# Patient Record
Sex: Female | Born: 1960 | Race: Black or African American | Hispanic: No | Smoking: Never smoker
Health system: Southern US, Community
[De-identification: ages and names within clinical notes are randomized; demographics above are authoritative.]

## PROBLEM LIST (undated history)

## (undated) DIAGNOSIS — F329 Major depressive disorder, single episode, unspecified: Secondary | ICD-10-CM

## (undated) DIAGNOSIS — G4730 Sleep apnea, unspecified: Secondary | ICD-10-CM

## (undated) DIAGNOSIS — I10 Essential (primary) hypertension: Secondary | ICD-10-CM

## (undated) DIAGNOSIS — F419 Anxiety disorder, unspecified: Secondary | ICD-10-CM

## (undated) DIAGNOSIS — F32A Depression, unspecified: Secondary | ICD-10-CM

## (undated) HISTORY — PX: BREAST BIOPSY: SHX20

## (undated) HISTORY — DX: Anxiety disorder, unspecified: F41.9

## (undated) HISTORY — DX: Depression, unspecified: F32.A

## (undated) HISTORY — DX: Essential (primary) hypertension: I10

## (undated) HISTORY — DX: Major depressive disorder, single episode, unspecified: F32.9

---

## 2001-05-10 ENCOUNTER — Ambulatory Visit (HOSPITAL_COMMUNITY): Admission: RE | Admit: 2001-05-10 | Discharge: 2001-05-11 | Payer: Self-pay | Admitting: Neurosurgery

## 2001-05-10 ENCOUNTER — Encounter: Payer: Self-pay | Admitting: Neurosurgery

## 2002-03-06 ENCOUNTER — Other Ambulatory Visit: Admission: RE | Admit: 2002-03-06 | Discharge: 2002-03-06 | Payer: Self-pay | Admitting: Family Medicine

## 2006-07-18 ENCOUNTER — Emergency Department: Payer: Self-pay | Admitting: Emergency Medicine

## 2012-10-17 ENCOUNTER — Ambulatory Visit: Payer: Self-pay | Admitting: Family Medicine

## 2012-12-17 ENCOUNTER — Ambulatory Visit: Payer: Self-pay | Admitting: Family Medicine

## 2014-02-24 ENCOUNTER — Ambulatory Visit: Payer: Self-pay | Admitting: Family Medicine

## 2015-04-16 LAB — COMPREHENSIVE METABOLIC PANEL
Albumin: 3.9 g/dL
Alkaline Phosphatase: 104 U/L
Anion Gap: 8 (ref 7–16)
BUN: 21 mg/dL — ABNORMAL HIGH
Bilirubin,Total: 0.6 mg/dL
Calcium, Total: 9.1 mg/dL
Chloride: 101 mmol/L
Co2: 30 mmol/L
Creatinine: 0.65 mg/dL
EGFR (African American): >60
EGFR (Non-African Amer.): >60
Glucose: 125 mg/dL — ABNORMAL HIGH
Potassium: 2.8 mmol/L — ABNORMAL LOW
SGOT(AST): 27 U/L
SGPT (ALT): 25 U/L
Sodium: 139 mmol/L
Total Protein: 7.2 g/dL

## 2015-04-16 LAB — CBC
HCT: 38.9 % (ref 35.0–47.0)
HGB: 12.7 g/dL (ref 12.0–16.0)
MCH: 27.2 pg (ref 26.0–34.0)
MCHC: 32.6 g/dL (ref 32.0–36.0)
MCV: 84 fL (ref 80–100)
Platelet: 287 10*3/uL (ref 150–440)
RBC: 4.65 10*6/uL (ref 3.80–5.20)
RDW: 14.3 % (ref 11.5–14.5)
WBC: 7.8 10*3/uL (ref 3.6–11.0)

## 2015-04-16 LAB — TROPONIN I: Troponin-I: 0.03 ng/mL

## 2015-04-17 ENCOUNTER — Other Ambulatory Visit: Payer: Self-pay | Admitting: Physician Assistant

## 2015-04-17 ENCOUNTER — Inpatient Hospital Stay
Admit: 2015-04-17 | Disposition: A | Discharge status: Home / Self Care | Payer: Self-pay | Attending: Internal Medicine | Admitting: Internal Medicine

## 2015-04-17 DIAGNOSIS — R079 Chest pain, unspecified: Secondary | ICD-10-CM

## 2015-04-17 DIAGNOSIS — I213 ST elevation (STEMI) myocardial infarction of unspecified site: Secondary | ICD-10-CM

## 2015-04-17 DIAGNOSIS — E876 Hypokalemia: Secondary | ICD-10-CM

## 2015-04-17 DIAGNOSIS — R7989 Other specified abnormal findings of blood chemistry: Secondary | ICD-10-CM

## 2015-04-17 DIAGNOSIS — I10 Essential (primary) hypertension: Secondary | ICD-10-CM

## 2015-04-17 LAB — BASIC METABOLIC PANEL
ANION GAP: 7 (ref 7–16)
BUN: 20 mg/dL
Calcium, Total: 9 mg/dL
Chloride: 102 mmol/L
Co2: 31 mmol/L
Creatinine: 0.65 mg/dL
EGFR (African American): >60
EGFR (Non-African Amer.): >60
GLUCOSE: 119 mg/dL — AB
Potassium: 3.4 mmol/L — ABNORMAL LOW
Sodium: 140 mmol/L

## 2015-04-17 LAB — TROPONIN I
TROPONIN-I: 0.06 ng/mL — AB
Troponin-I: 0.04 ng/mL — ABNORMAL HIGH

## 2015-04-17 LAB — MAGNESIUM: Magnesium: 2 mg/dL

## 2015-04-17 LAB — CK-MB
CK-MB: 2 ng/mL
CK-MB: 2.3 ng/mL
CK-MB: 2.3 ng/mL

## 2015-04-20 ENCOUNTER — Encounter: Payer: Self-pay | Admitting: Cardiovascular Disease

## 2015-04-26 NOTE — H&P (Signed)
PATIENT NAME:  Rachel Short, Rachel Short MR#:  [id number] DATE OF BIRTH:  [birth year]  DATE OF ADMISSION:  04/17/2015  REFERRING PHYSICIAN:  Gretchen Short. Beather Arbour, MD  PRIMARY CARE PHYSICIAN:  [facility]  CHIEF COMPLAINT:  Palpitations.   HISTORY OF PRESENT ILLNESS:  This is a 54 year old African-American female with a history of hypertension, essential, as well as anxiety, not otherwise specified, presenting with palpitations. She describes acute onset of symptoms around 5:00 p.m. on 04/16/2015, as palpitations and weakness. These symptoms lasted until 9:00 p.m. Given the duration, this prompted her visit to the hospital for further workup and evaluation. She actually denies any frank chest pain, shortness of breath, or other symptomatology.   Initial ER course including laboratory data and EKG:  Findings were essentially unremarkable; however, they decided to trend her cardiac enzymes and noted an upward trend. Cardiac enzyme is currently 0.06, upward from 0.03. She was subsequently given Lovenox, and the Emergency Department asked for hospital admission. The patient currently denies any further symptoms.   REVIEW OF SYSTEMS: CONSTITUTIONAL:  Denies fevers, chills, fatigue, or weakness.  EYES:  Denies blurred vision, double vision, or eye pain.  EARS, NOSE, AND THROAT:  Denies tinnitus, ear pain, or hearing loss. RESPIRATORY:  Denies cough or shortness of breath.  CARDIOVASCULAR:  Denies chest pain or palpitations. Denies any edema.  GASTROINTESTINAL:  Denies nausea, vomiting, diarrhea, or abdominal pain.  GENITOURINARY:  Denies dysuria or hematuria.  ENDOCRINE:  Denies nocturia or thyroid problems.  HEMATOLOGIC AND LYMPHATIC:  Denies easy bruising or bleeding. SKIN:  Denies rash or lesion. MUSCULOSKELETAL:  Denies pain in the neck, back, shoulders, knees, or hips or arthritic symptoms.  NEUROLOGIC:  Denies paralysis or paresthesias.  PSYCHIATRIC:  Positive for anxiety as described above. Denies  any depressive symptoms.   Otherwise, full review of systems performed by me is negative.   PAST MEDICAL HISTORY:  Essential hypertension; anxiety, not otherwise specified.   SOCIAL HISTORY:  Positive for secondhand smoke exposure. Occasional alcohol use. Denies any drug use.   FAMILY HISTORY:  Denies any known cardiovascular or pulmonary disorders.   ALLERGIES:  No known drug allergies.   HOME MEDICATIONS:  Include ibuprofen 200 mg 4 tablets every 6 hours as needed for pain, Tylenol 500 mg 2 tablets q. 6 hours as needed for pain, clonazepam 0.5 mg p.o. daily as needed for anxiety, gabapentin 300 mg p.o. at bedtime, fluoxetine 20 mg p.o. daily, hydrochlorothiazide 25 mg p.o. daily.   PHYSICAL EXAMINATION: VITAL SIGNS:  Temperature 98.2, heart rate 78, respirations 17, blood pressure 135/90, saturation 96% on room air. Weight 78 kg, BMI 30.2.  GENERAL:  Obese African-American female currently in no acute distress.  HEAD:  Normocephalic, atraumatic.  EYES:  Pupils are equal, round, and reactive to light. Extraocular muscles are intact. No scleral icterus. Moist mucous membranes. Dentition is intact. No abscess is noted.  EARS, NOSE, AND THROAT:  Clear without exudates. No external lesions.  NECK:  Supple. No thyromegaly. No nodules. No JVD.  PULMONARY:  Clear to auscultation bilaterally without wheezes, rales, or rhonchi. No use of accessory muscles. Good respiratory effort.  CHEST:  Nontender to palpation.  CARDIOVASCULAR:  S1 and S2, regular rate and rhythm. No murmurs, rubs, or gallops. No edema. Pedal pulses are 2+ bilaterally.  GASTROINTESTINAL:  Soft, nontender, nondistended. No masses. Positive bowel sounds. No hepatosplenomegaly. MUSCULOSKELETAL:  No swelling, clubbing, or edema. Range of motion is full in all extremities.  NEUROLOGIC:  Cranial nerves II through  XII are intact. No gross neurological deficit. Sensation is intact. Reflexes are intact.  SKIN:  No ulceration, lesion,  rash, or cyanosis. Skin is warm and dry. Turgor is intact.  PSYCHIATRIC:  Mood and affect are within normal limits. The patient is awake, alert, and oriented x 3. Insight and judgment are intact.   LABORATORY DATA:  EKG performed:  Normal sinus rhythm. No ST-T wave abnormalities. Remainder of laboratory data:  Sodium 139, potassium 2.9, chloride 101, bicarbonate 30, BUN 21, creatinine 0.65, glucose 125. LFTs are within normal limits. Troponin is 0.03, trending upwards to 0.06. WBC is 7.8, hemoglobin 12.7, and platelets are 287,000.   ASSESSMENT AND PLAN:  A 54 year old African-American female with a history of essential hypertension, presenting with palpitations and noted upward cardiac enzyme trend in the Emergency Department and received Lovenox via ER staff.    1.  Non-ST segment elevation myocardial infarction indicated by elevated troponins. Initiate aspirin and statin therapy. We will place on telemetry and trend cardiac enzymes x 3 in total. Consult cardiology. She has already received therapeutic dosing of Lovenox with continued upward trend of enzymes. We will continue therapeutic dosing.  2.  Hypokalemia. Replace potassium, goal 4 to 5.  3.  Hypertension, essential. We will hold hydrochlorothiazide for now and can restart the next day.  4.  Venous thromboembolism prophylaxis with therapeutic Lovenox.   CODE STATUS:  The patient is a full code.  TIME SPENT:  35 minutes.    ____________________________ Aaron Mose. Naitik Hermann, MD dkh:nb D: 04/17/2015 03:42:05 ET T: 04/17/2015 04:19:09 ET JOB#: [id number]  cc: Aaron Mose. Crissa Sowder, MD, <Dictator> Hadassa Cermak Woodfin Ganja MD ELECTRONICALLY SIGNED 04/19/2015 2:47

## 2015-04-26 NOTE — Consult Note (Signed)
General Aspect Primary Cardiologist: New to [facility] ___________________  54 year old female with history of HTN, anxiety, obesity, DDD, and fibromyalgia who presented to [facility] on 4/21 with 4 hour history of palpitations.  ___________________  PMH: 1. HTN 2. Anxiety 3. Obesity 4. DDD 5. Fibromyalgia ____________________   Present Illness 54 year old female with the above problem list who presented to [facility] on 4/21 with the above CC. No prior known cardiac history. She has never seen a cardiologist.   She has a long history of anxiety and palpitations and she usually just lets self resolve. However, on 4/21 she had an episode of on anxiety and palpitations that last longer than usual for her prompting her to come in. She reports this episode lasting approximately 4 hours until EMS arrived, then they self resolved. Upon interviewing her she initially stated she did have chest pain lasting 4 hours, she later denied this statement. There was no associated SOB, diaphoresis, presyncope, or syncope. There was some nausea without emesis. She reports having had some nausea the prior day as well, reports eating some bad food. She has been symptom free since her arrival. She denies any tobacco use (lives with a smoker), rare ETOH, no illicits.   Upon her arrival to [facility] she was found to have a troponin of 0.03-->0.06-->0.04. Echo showed EF 55-60%, mild LVH, mildly dilated LA. EKG showed sinus tachycardia, 102 bpm, no significant st/t changes. K+ 2.8-->3.4. She was given Lovenox 80 mg x 1. She is currently asymptomatic.   Physical Exam:  GEN no acute distress, obese   HEENT PERRL, hearing intact to voice, moist oral mucosa   NECK supple  trachea midline  no JVD   RESP normal resp effort  clear BS   CARD Regular rate and rhythm  No murmur   ABD denies tenderness  soft   EXTR negative edema   SKIN normal to palpation   NEURO cranial nerves intact   PSYCH alert, A+O to time, place, person    Review of Systems:  General: No Complaints   Skin: No Complaints   ENT: No Complaints   Eyes: No Complaints   Neck: No Complaints   Respiratory: No Complaints   Cardiovascular: Palpitations   Gastrointestinal: Nausea   Genitourinary: No Complaints   Vascular: No Complaints   Musculoskeletal: No Complaints   Neurologic: No Complaints   Hematologic: No Complaints   Endocrine: No Complaints   Psychiatric: Anxiety   Review of Systems: All other systems were reviewed and found to be negative   Medications/Allergies Reviewed Medications/Allergies reviewed   Family & Social History:  Family and Social History:  Family History father: killed in late 102s; mother: [place]; aunt: CAD   Social History negative tobacco, negative ETOH, positive ETOH, negative Illicit drugs   Place of Living Home     Anxiety:    Osteoarthritis:    Hypertension:    PTSD:    Degenerative Disc Disease:    Borderline Diabetic:    sleep apnea:    Hysterectomy:   Home Medications: Medication Instructions Status  gabapentin 300 mg oral capsule 1-2 cap(s) orally once a day (at bedtime) Active  hydrochlorothiazide 25 mg oral tablet 1 tab(s) orally once a day Active  clonazePAM 0.5 mg oral tablet 1 tab(s) orally once a day, As Needed - for Anxiety, Nervousness Active  FLUoxetine 20 mg oral capsule 1 cap(s) orally once a day Active  Tylenol 500 mg oral tablet 2 tab(s) orally every 6 hours,  As Needed - for Headache, for Pain  Active  ibuprofen 200 mg oral tablet 4 tab(s) orally every 6 hours, As Needed - for Headache, for Pain  Active   Lab Results:  Routine Chem:  21-Apr-16 22:26   Potassium, Serum  2.8 (3.5-5.1 NOTE: New Reference Range  03/03/15)  22-Apr-16 05:39   Magnesium, Serum 2.0 (1.7-2.4 THERAPEUTIC RANGE: 4-7 mg/dL TOXIC: > 10 mg/dL  ----------------------- NOTE: New Reference Range  03/03/15)  Glucose, Serum  119 (65-99 NOTE: New Reference Range  03/03/15)  BUN  20 (6-20 NOTE: New Reference Range  03/03/15)  Creatinine (comp) 0.65 (0.44-1.00 NOTE: New Reference Range  03/03/15)  Sodium, Serum 140 (135-145 NOTE: New Reference Range  03/03/15)  Potassium, Serum  3.4 (3.5-5.1 NOTE: New Reference Range  03/03/15)  Chloride, Serum 102 (101-111 NOTE: New Reference Range  03/03/15)  CO2, Serum 31 (22-32 NOTE: New Reference Range  03/03/15)  Calcium (Total), Serum 9.0 (8.9-10.3 NOTE: New Reference Range  03/03/15)  Anion Gap 7  eGFR (African American) >60  eGFR (Non-African American) >60 (eGFR values <70m/min/1.73 m2 may be an indication of chronic kidney disease (CKD). Calculated eGFR is useful in patients with stable renal function. The eGFR calculation will not be reliable in acutely ill patients when serum creatinine is changing rapidly. It is not useful in patients on dialysis. The eGFR calculation may not be applicable to patients at the low and high extremes of body sizes, pregnant women, and vegetarians.)  Result Comment - TROPONIN  - PREVIOUSLY CALLED  - 04-17-15 '@[id number]'  BY AJO  - AJO  Result(s) reported on 17 Apr 2015 at 06:49AM.  Cardiac:  21-Apr-16 22:26   Troponin I 0.03 (0.00-0.03 0.03 ng/mL or less: NEGATIVE  Repeat testing in 3-6 hrs  if clinically indicated. >0.05 ng/mL: POTENTIAL  MYOCARDIAL INJURY. Repeat  testing in 3-6 hrs if  clinically indicated. NOTE: An increase or decrease  of 30% or more on serial  testing suggests a  clinically important change NOTE: New Reference Range  03/03/15)  CPK-MB, Serum 2.0 (0.5-5.0 NOTE: New Reference Range  03/03/15)  22-Apr-16 01:22   Troponin I  0.06 (0.00-0.03 0.03 ng/mL or less: NEGATIVE  Repeat testing in 3-6 hrs  if clinically indicated. >0.05 ng/mL: POTENTIAL  MYOCARDIAL INJURY. Repeat  testing in 3-6 hrs if  clinically indicated. NOTE: An increase or decrease  of 30% or more on serial  testing suggests a  clinically important change NOTE: New Reference  Range  03/03/15)  CPK-MB, Serum 2.3 (0.5-5.0 NOTE: New Reference Range  03/03/15)    05:39   Troponin I  0.04 (0.00-0.03 0.03 ng/mL or less: NEGATIVE  Repeat testing in 3-6 hrs  if clinically indicated. >0.05 ng/mL: POTENTIAL  MYOCARDIAL INJURY. Repeat  testing in 3-6 hrs if  clinically indicated. NOTE: An increase or decrease  of 30% or more on serial  testing suggests a  clinically important change NOTE: New Reference Range  03/03/15)  CPK-MB, Serum 2.3 (0.5-5.0 NOTE: New Reference Range  03/03/15)  Routine Hem:  21-Apr-16 22:26   WBC (CBC) 7.8  RBC (CBC) 4.65  Hemoglobin (CBC) 12.7  Hematocrit (CBC) 38.9  Platelet Count (CBC) 287 (Result(s) reported on 16 Apr 2015 at 10:42PM.)   EKG:  EKG Interp. by me   Interpretation sinus tachycardia, 102 bpm, no significant st/t changes   Radiology Results: Cardiology:    22-Apr-16 07:19, Echo Doppler  Echo Doppler   REASON FOR EXAM:      COMMENTS:  PROCEDURE: [facility] - ECHO DOPPLER COMPLETE(TRANSTHOR)  - Apr 17 2015  7:19AM     RESULT: Echocardiogram Report    Patient Name:   Rachel Short Date of Exam: 04/17/2015  Medical Rec #:  [id number]           Custom1:  Date of Birth:  January 02, 1961        Height:       64.0 in  Patient Age:    89 years         Weight:       177.0 lb  Patient Gender: F                BSA:          1.86 m??    Indications: MI  Sonographer:    Sherrie Sport RDCS  Referring Phys: Valentino Nose, K    Summary:   1. Left ventricular ejection fraction, by visual estimation, is 55 to   60%.   2. Normal global left ventricular systolic function.   3. Mild concentric left ventricular hypertrophy.   4. Mildly dilated left atrium.  2D AND M-MODE MEASUREMENTS (normal ranges within parentheses):  Left Ventricle:          Normal  IVSd (2D):      1.50 cm (0.7-1.1)  LVPWd (2D):     1.25 cm (0.7-1.1) Aorta/LA:                  Normal  LVIDd (2D):     3.45 cm (3.4-5.7) Aortic Root (2D): 3.00 cm  (2.4-3.7)  LVIDs (2D):     2.36 cm           Left Atrium (2D): 2.60 cm (1.9-4.0)  LV FS (2D):     31.6 %   (>25%)  LV EF (2D):     60.7 %   (>50%)                                    Right Ventricle:                                    RVd (2D):        0.45 cm  LV DIASTOLIC FUNCTION:  MV Peak E: 0.83 m/s E/e' Ratio: 10.80  MV Peak A: 0.73 m/s Decel Time: 269 msec  E/A Ratio: 1.13  SPECTRAL DOPPLER ANALYSIS (where applicable):  Mitral Valve:  MV P1/2 Time: 78.01 msec  MV Area, PHT: 2.82 cm??  Aortic Valve: AoVMax Vel: 1.30 m/s AoV Peak PG: 6.8 mmHg AoV Mean PG:  LVOT Vmax: 1.08 m/s LVOT VTI:  LVOT Diameter: 2.00 cm  AoV Area, Vmax: 2.61 cm?? AoV Area, VTI:  AoV Area, Vmn:  Tricuspid Valve and PA/RV Systolic Pressure: TR Max Velocity: 2.17 m/s RA     Pressure: 5 mmHg RVSP/PASP: 23.8 mmHg  Pulmonic Valve:  PV Max Velocity: 0.87 m/s PV Max PG: 3.0 mmHg PV Mean PG:    PHYSICIAN INTERPRETATION:  Left Ventricle: The left ventricular internal cavity size was normal.   Mild concentric left ventricular hypertrophy. Global LV systolic function   was normal. Left ventricular ejection fraction, by visual estimation, is   55 to 60%. Spectral Doppler shows normal pattern of LV diastolic filling.  Right Ventricle: Normal right ventricular size, wall thickness, and   systolic function.  Left Atrium: The left atrium is mildly dilated.  Right Atrium: The right atrium is normal in size and structure.  Pericardium: There is no evidence of pericardial effusion.  Mitral Valve: The mitral valve is normal in structure. No evidence of     mitral valve stenosis. Trace mitral valve regurgitation is seen.  Tricuspid Valve: The tricuspid valve is normal. Trivial tricuspid   regurgitation is visualized. The tricuspid regurgitant velocity is 2.17   m/s, and with an assumed right atrial pressure of 5 mmHg, the estimated   right ventricular systolic pressure is normal at 23.8 mmHg.  Aortic Valve: The aortic  valve is trileaflet and structurally normal,   with normal leaflet excursion; without any evidence of aortic stenosis or   insufficiency.  Pulmonic Valve: The pulmonic valve is not well seen. No indication of   pulmonic valve regurgitation.  Aorta: The aortic root is normal in size and structure.  Venous: The inferior vena cava was normal. The inferior vena cavawas   normal sized with respiratory size variation greater than 50%.    [id number] Kathlyn Sacramento MD  Electronically signed by [id number] Kathlyn Sacramento MD  Signature Date/Time: 04/17/2015/7:59:40 AM    *** Final ***    IMPRESSION: .        Verified By: Mertie Clause. ARIDA, M.D., MD    No Known Allergies:   Vital Signs/Nurse's Notes: **Vital Signs.:   22-Apr-16 08:07  Vital Signs Type Routine  Temperature Temperature (F) 98.1  Celsius 36.7  Temperature Source oral  Pulse Pulse 98  Respirations Respirations 18  Systolic BP Systolic BP 683  Diastolic BP (mmHg) Diastolic BP (mmHg) 78  Mean BP 91  Pulse Ox % Pulse Ox % 98  Pulse Ox Activity Level  At rest  Oxygen Delivery Room Air/ 21 %    Impression 54 year old female with history of HTN, anxiety, obesity, DDD, and fibromyalgia who presented to [facility] on 4/21 with 4 hour history of palpitations.  1. Elevated troponin: -Mildly elevated and down trending -Likely supply demand ischemia in the setting of tachy-palpitations -Plan for treadmill Myoview today to evaluate for high risk ischemia -Echo showed normal LV function without wall motion abnormalities  -Continue aspirin 81 mg  2. Hypokalemia: -Admission K+ 2.8-->3.4 -Continue repletion to goal 4.0  3. Anxiety: -Continue home medications -Follow up with PCP   Electronic Signatures for Addendum Section:  Kathlyn Sacramento (MD) (Signed Addendum 22-Apr-16 13:18)  The patient was seen and examined. Agree with the above. She reports intermittent palpitations and had a prolonged episode before presentation. She denied any chest  discomfort to me. No exertional symptoms. TnI was mildly elevated. No murmurs by exam. ECG with no ischemic changes. She was hypokalemia on presentation. She takes HCTZ for hypertension.  suspect some form of tachyarrhythmia. Elevated tnI is likely due to supply demand ischemia.  Recommend: treadmil myoview today.  Switch HCTZ to Metoprolol 25 mg bid.   Electronic Signatures: Kathlyn Sacramento (MD)  (Signed 22-Apr-16 13:18)  Co-Signer: General Aspect/Present Illness, History and Physical Exam, Review of System, Family & Social History, Past Medical History, Home Medications, Labs, EKG , Radiology, Allergies, Vital Signs/Nurse's Notes, Impression/Plan Ayiana Winslett M (PA-C)  (Signed 22-Apr-16 09:57)  Authored: General Aspect/Present Illness, History and Physical Exam, Review of System, Family & Social History, Past Medical History, Home Medications, Labs, EKG , Radiology, Allergies, Vital Signs/Nurse's Notes, Impression/Plan   Last Updated: 22-Apr-16 13:18 by Kathlyn Sacramento (MD)

## 2015-04-26 NOTE — Discharge Summary (Signed)
PATIENT NAME:  Rachel Short, LOGUE MR#:  [id number] DATE OF BIRTH:  Sep 10, 1961  DATE OF ADMISSION:  04/17/2015 DATE OF DISCHARGE:  04/17/2015  PRIMARY CARE PHYSICIAN:  Nonlocal.   CONSULTATIONS:  Kathlyn Sacramento, MD, cardiologist.   DISCHARGE DIAGNOSES:  1. Elevated troponin due to demand ischemia in the setting of tachycardia and palpitations.  2. Hypokalemia.  3. Anxiety.  4. Obesity.   CONDITION: Stable.   CODE STATUS:  FULL CODE.   HOME MEDICATIONS: Please refer to the medication reconciliation list.  Hydrochlorothiazide  was discontinued due to hypokalemia.  We will start Lopressor 25 mg 0.5 tablets b.i.d., ibuprofen is discontinued.   DIET: Low sodium, low fat, low cholesterol diet.   ACTIVITY: As tolerated.   FOLLOWUP CARE: With PCP and Dr. Fletcher Anon within 1-2 weeks.   REASON FOR ADMISSION: Palpitations.   HOSPITAL COURSE: The patient is a 54 year old African American female with a history of hypertension and anxiety, comes to ED due to palpitation. For detailed history and physical examination, please refer to the admission note dictated by Dr. Lavetta Nielsen. On admission date, the patient's troponin was 0.04 but is up to 0.06. The patient has been treated with aspirin and statin.   Dr. Fletcher Anon suggests a stress test and echocardiograph.  Echocardiograph showed normal ejection fraction at 55-60%. The patient got stress test.  which is normal. The patient has no chest pain. Her vital signs are stable. She is clinically stable; will be discharged to home today. I discussed the patient's discharge plan with the patient, nurse, and case manager.   TIME SPENT: About 37 minutes.     ____________________________ Demetrios Loll, MD qc:tr D: 04/17/2015 16:19:23 ET T: 04/17/2015 17:46:02 ET JOB#: [id number]  cc: Demetrios Loll, MD, <Dictator> Demetrios Loll MD ELECTRONICALLY SIGNED 04/18/2015 16:04

## 2015-05-14 ENCOUNTER — Encounter: Payer: Self-pay | Admitting: Cardiovascular Disease

## 2015-06-03 ENCOUNTER — Encounter: Payer: Self-pay | Admitting: *Deleted

## 2015-06-04 ENCOUNTER — Encounter: Payer: Self-pay | Admitting: *Deleted

## 2015-06-04 ENCOUNTER — Encounter: Payer: Self-pay | Admitting: Cardiovascular Disease

## 2016-09-09 ENCOUNTER — Other Ambulatory Visit: Payer: Self-pay | Admitting: Internal Medicine

## 2016-09-09 DIAGNOSIS — M546 Pain in thoracic spine: Secondary | ICD-10-CM

## 2017-08-21 ENCOUNTER — Emergency Department
Admission: EM | Admit: 2017-08-21 | Discharge: 2017-08-21 | Disposition: A | Discharge status: Home / Self Care | Payer: Medicare Other | Attending: Emergency Medicine | Admitting: Emergency Medicine

## 2017-08-21 ENCOUNTER — Emergency Department: Payer: Medicare Other

## 2017-08-21 ENCOUNTER — Encounter: Payer: Self-pay | Admitting: *Deleted

## 2017-08-21 DIAGNOSIS — R000 Tachycardia, unspecified: Secondary | ICD-10-CM | POA: Diagnosis present

## 2017-08-21 DIAGNOSIS — E876 Hypokalemia: Secondary | ICD-10-CM | POA: Insufficient documentation

## 2017-08-21 DIAGNOSIS — I471 Supraventricular tachycardia: Secondary | ICD-10-CM | POA: Insufficient documentation

## 2017-08-21 DIAGNOSIS — Z7982 Long term (current) use of aspirin: Secondary | ICD-10-CM | POA: Insufficient documentation

## 2017-08-21 DIAGNOSIS — I10 Essential (primary) hypertension: Secondary | ICD-10-CM | POA: Insufficient documentation

## 2017-08-21 LAB — CBC WITH DIFFERENTIAL/PLATELET
BASOS ABS: 0.1 10*3/uL (ref 0–0.1)
Basophils Relative: 1 %
Eosinophils Absolute: 0 10*3/uL (ref 0–0.7)
Eosinophils Relative: 1 %
HEMATOCRIT: 36.2 % (ref 35.0–47.0)
Hemoglobin: 12.2 g/dL (ref 12.0–16.0)
LYMPHS PCT: 24 %
Lymphs Abs: 1.4 10*3/uL (ref 1.0–3.6)
MCH: 28.9 pg (ref 26.0–34.0)
MCHC: 33.7 g/dL (ref 32.0–36.0)
MCV: 85.9 fL (ref 80.0–100.0)
Monocytes Absolute: 0.3 10*3/uL (ref 0.2–0.9)
Monocytes Relative: 5 %
NEUTROS ABS: 4.1 10*3/uL (ref 1.4–6.5)
Neutrophils Relative %: 69 %
Platelets: 263 10*3/uL (ref 150–440)
RBC: 4.21 MIL/uL (ref 3.80–5.20)
RDW: 14.3 % (ref 11.5–14.5)
WBC: 5.9 10*3/uL (ref 3.6–11.0)

## 2017-08-21 LAB — BASIC METABOLIC PANEL
Anion gap: 9 (ref 5–15)
BUN: 22 mg/dL — ABNORMAL HIGH (ref 6–20)
CO2: 27 mmol/L (ref 22–32)
Calcium: 9.3 mg/dL (ref 8.9–10.3)
Chloride: 106 mmol/L (ref 101–111)
Creatinine, Ser: 0.53 mg/dL (ref 0.44–1.00)
GFR calc Af Amer: >60 mL/min (ref >60)
GLUCOSE: 120 mg/dL — AB (ref 65–99)
POTASSIUM: 3.3 mmol/L — AB (ref 3.5–5.1)
Sodium: 142 mmol/L (ref 135–145)

## 2017-08-21 LAB — URINALYSIS, COMPLETE (UACMP) WITH MICROSCOPIC
BILIRUBIN URINE: NEGATIVE
Bacteria, UA: NONE SEEN
Glucose, UA: NEGATIVE mg/dL
HGB URINE DIPSTICK: NEGATIVE
Ketones, ur: 5 mg/dL — AB
LEUKOCYTES UA: NEGATIVE
NITRITE: NEGATIVE
PROTEIN: 30 mg/dL — AB
RBC / HPF: NONE SEEN RBC/hpf (ref 0–5)
Specific Gravity, Urine: 1.017 (ref 1.005–1.030)
pH: 6 (ref 5.0–8.0)

## 2017-08-21 LAB — TROPONIN I: Troponin I: <0.03 ng/mL (ref <0.03)

## 2017-08-21 MED ORDER — POTASSIUM CHLORIDE CRYS ER 20 MEQ PO TBCR
40.0000 meq | EXTENDED_RELEASE_TABLET | Freq: Once | ORAL | Status: AC
  Administered 2017-08-21: 40 meq via ORAL
  Filled 2017-08-21: qty 2

## 2017-08-21 MED ORDER — POTASSIUM CHLORIDE ER 10 MEQ PO TBCR: 10.0000 meq | EXTENDED_RELEASE_TABLET | Freq: Every day | ORAL | 0 refills | Status: DC

## 2017-08-21 MED ORDER — SODIUM CHLORIDE 0.9 % IV BOLUS (SEPSIS)
500.0000 mL | Freq: Once | INTRAVENOUS | Status: AC
  Administered 2017-08-21: 500 mL via INTRAVENOUS

## 2017-08-21 NOTE — ED Triage Notes (Signed)
Pt brought in via ems from home with rapid heart rte.   Ems gave 6mg  of adenisone.   Sinus tach on monitor. No chest pain.  Pt alert.  Iv in place.  md with pt.

## 2017-08-21 NOTE — ED Provider Notes (Signed)
[facility] Emergency Department Provider Note ____________________________________________   First MD Initiated Contact with Patient 08/21/17 1612     (approximate)  I have reviewed the triage vital signs and the nursing notes.   HISTORY  Chief Complaint Tachycardia    HPI Rachel Short is a 56 y.o. female with a history of anxiety and hypertension who presents with palpitations acute onset approximately 3 hours ago, associated with feeling lightheaded and wobbly in her legs, and intermittent over the last few hours. Patient thought she was was having an anxiety attack and took her anxiety medication, but it didn't help. She denies any previous history of palpitations as intense as she felt today. Patient denies chest pain, shortness of breath, or vomiting. She does report some nausea. Denies any recent illness, fever or chills, or weakness.    Past Medical History:  Diagnosis Date  . Anxiety and depression   . Hypertension     There are no active problems to display for this patient.   No past surgical history on file.  Prior to Admission medications   Medication Sig Start Date End Date Taking? Authorizing Provider  aspirin 81 MG chewable tablet Chew by mouth daily.   Yes [provider]  atorvastatin (LIPITOR) 20 MG tablet Take 20 mg by mouth daily.   Yes [provider]  FLUoxetine (PROZAC) 40 MG capsule Take 40 mg by mouth daily.   Yes [provider]  metoprolol tartrate (LOPRESSOR) 25 MG tablet Take 12.5 mg by mouth 2 (two) times daily.   Yes [provider]  cyclobenzaprine (FLEXERIL) 10 MG tablet Take 1 tablet by mouth daily as needed. 08/05/17   [provider]  gabapentin (NEURONTIN) 300 MG capsule Take 300 mg by mouth 2 (two) times daily as needed.    [provider]  potassium chloride (K-DUR) 10 MEQ tablet Take 1 tablet (10 mEq total) by mouth daily. 08/22/17 09/05/17  Arta Silence, MD    Allergies Patient has no known allergies.  No family history on file.  Social History Social History  Substance Use Topics  . Smoking status: Never Smoker  . Smokeless tobacco: Never Used  . Alcohol use No    Review of Systems  Constitutional: No fever/chills Eyes: No visual changes. ENT: Positive for throat discomfort. Cardiovascular: Denies chest pain. Positive for palpitations. Respiratory: Denies shortness of breath. Gastrointestinal: No nausea, no vomiting.  No diarrhea.  Genitourinary: Negative for dysuria.  Musculoskeletal: Negative for back pain. Skin: Negative for rash. Neurological: Negative for headaches, focal weakness or numbness. Positive for lightheadedness.   ____________________________________________   PHYSICAL EXAM:  VITAL SIGNS: ED Triage Vitals  Enc Vitals Group     BP 08/21/17 1617 99/82     Pulse Rate 08/21/17 1617 (!) 115     Resp 08/21/17 1617 20     Temp 08/21/17 1617 98.6 F (37 C)     Temp Source 08/21/17 1617 Oral     SpO2 08/21/17 1617 99 %     Weight 08/21/17 1619 176 lb (79.8 kg)     Height 08/21/17 1619 5\' 5"  (1.651 m)     Head Circumference --      Peak Flow --      Pain Score --      Pain Loc --      Pain Edu? --      Excl. in [place]? --     Constitutional: Alert and oriented. Well appearing and in no  acute distress. Eyes: Conjunctivae are normal.  Head: Atraumatic. Nose: No congestion/rhinnorhea. Mouth/Throat: Slightly dry mucous membranes.  Neck: Normal range of motion.  Cardiovascular: Tachycardic, regular rhythm. Grossly normal heart sounds.  Good peripheral circulation. Respiratory: Normal respiratory effort.  No retractions. Lungs CTAB. Gastrointestinal: Soft and nontender. No distention.  Genitourinary: No CVA tenderness. Musculoskeletal: No lower extremity edema.  Extremities warm and well perfused.  Neurologic:  Normal speech and language. No gross focal neurologic deficits are appreciated.    Skin:  Skin is warm and dry. No rash noted. Psychiatric: Mood and affect are normal. Speech and behavior are normal.  ____________________________________________   LABS (all labs ordered are listed, but only abnormal results are displayed)  Labs Reviewed  BASIC METABOLIC PANEL - Abnormal; Notable for the following:       Result Value   Potassium 3.3 (*)    Glucose, Bld 120 (*)    BUN 22 (*)    All other components within normal limits  URINALYSIS, COMPLETE (UACMP) WITH MICROSCOPIC - Abnormal; Notable for the following:    Color, Urine YELLOW (*)    APPearance CLOUDY (*)    Ketones, ur 5 (*)    Protein, ur 30 (*)    Squamous Epithelial / LPF 6-30 (*)    All other components within normal limits  CBC WITH DIFFERENTIAL/PLATELET  TROPONIN I   ____________________________________________  EKG  ED ECG REPORT I, Arta Silence, the attending physician, personally viewed and interpreted this ECG.  Date: 08/21/2017 EKG Time: 1616 Rate: 111 Rhythm: sinus tachycardia QRS Axis: normal Intervals: normal ST/T Wave abnormalities: normal Narrative Interpretation: sinus tachycardia, no other acute findings  ____________________________________________  RADIOLOGY    ____________________________________________   PROCEDURES  Procedure(s) performed: No    Critical Care performed: No ____________________________________________   INITIAL IMPRESSION / ASSESSMENT AND PLAN / ED COURSE  Pertinent labs & imaging results that were available during my care of the patient were reviewed by me and considered in my medical decision making (see chart for details).  56 year old female presents with palpitations of acute onset associated with lightheadedness and some nausea. Somewhat waxing and waning in intensity and improved currently. Per EMS patient was found to be in SVT which resolved with 6 mg of adenosine. Currently in the ED patient has sinus tachycardia to the 110s,  with otherwise normal VS, she is relatively well-appearing, and exam is otherwise unremarkable. Overall suspect most likely SVT although there is no EMS rhythm strip available to prove this. Unclear why patient is currently slightly tachycardic but suspect there may be underlying baseline mild dehydration or other cause for sinus tachycardia. Do not suspect the patient was in afib given acute resolution of the arrhythmia. Low suspicion for cardiac ischemia.  Given no chest pain, shortness of breath, hypoxia,unilateral leg swelling, or any risk factors for PE or DVT, there is no clinical evidence to suspect PE currently.  Plan: Cardiac monitor, basic labs, troponin 1, chest x-ray and reassess.    ----------------------------------------- 7:53 PM on 08/21/2017 -----------------------------------------  Patient's repeat heart rate is now in the 80s and she is in sinus rhythm on the monitor. Workup is unremarkable except for mild hypokalemia - will d/c with course of outpatient PO potassium.  She has had no recurrence of the palpitations in the emergency department.  I instructed patient to follow up with her primary care doctor and make an appointment with a cardiologist. She was instructed to return to the ER immediately for new or recurrent palpitations, chest tightness  or any other symptoms that concern her.  ____________________________________________   FINAL CLINICAL IMPRESSION(S) / ED DIAGNOSES  Final diagnoses:  SVT (supraventricular tachycardia) (HCC)  Hypokalemia      NEW MEDICATIONS STARTED DURING THIS VISIT:  New Prescriptions   POTASSIUM CHLORIDE (K-DUR) 10 MEQ TABLET    Take 1 tablet (10 mEq total) by mouth daily.     Note:  This document was prepared using Dragon voice recognition software and may include unintentional dictation errors.    Arta Silence, MD 08/21/17 2044

## 2017-08-21 NOTE — ED Notes (Signed)
nsr on monitor.  Pt alert.  Iv dc'ed.  D/c inst to pt and family.

## 2017-08-21 NOTE — ED Notes (Signed)
esignature signed by pt.  D/c inst to pt.

## 2017-08-21 NOTE — ED Notes (Signed)
Pt pulled iv out accidentally.    Iv restarted.  Pt up to bathroom with assistance

## 2017-08-21 NOTE — Discharge Instructions (Signed)
Follow-up with your primary care provider within the next week. You should also make an appointment to follow-up with a cardiologist.  Return to the ER for any new, worsening, or recurrent symptoms that concern you.

## 2017-08-21 NOTE — ED Notes (Signed)
Pt alert.  Pt had episode of svt at home  Sinus tach on monitor now.  Iv in place.  No n/v/d.  Pt alert.  Speech clear.  Pt denies chest pain or sob.

## 2017-10-18 ENCOUNTER — Other Ambulatory Visit: Payer: Self-pay | Admitting: Primary Care

## 2017-10-18 DIAGNOSIS — Z1231 Encounter for screening mammogram for malignant neoplasm of breast: Secondary | ICD-10-CM

## 2017-11-03 ENCOUNTER — Ambulatory Visit
Admission: RE | Admit: 2017-11-03 | Discharge: 2017-11-03 | Disposition: A | Discharge status: Home / Self Care | Payer: Medicare Other | Source: Ambulatory Visit | Attending: Primary Care | Admitting: Primary Care

## 2017-11-03 DIAGNOSIS — Z1231 Encounter for screening mammogram for malignant neoplasm of breast: Secondary | ICD-10-CM | POA: Insufficient documentation

## 2018-01-15 ENCOUNTER — Telehealth: Payer: Self-pay

## 2018-01-15 NOTE — Telephone Encounter (Signed)
Gastroenterology Pre-Procedure Review  Request Date:   Requesting Physician: Dr.    PATIENT REVIEW QUESTIONS: The patient responded to the following health history questions as indicated:    1. Are you having any GI issues? No  2. Do you have a personal history of Polyps? No  3. Do you have a family history of Colon Cancer or Polyps? Yes, Mother Colon mass/CA 4. Diabetes Mellitus? No  5. Joint replacements in the past 12 months? No  6. Major health problems in the past 3 months? No  7. Any artificial heart valves, MVP, or defibrillator? No     MEDICATIONS & ALLERGIES:    Patient reports the following regarding taking any anticoagulation/antiplatelet therapy:   Plavix, Coumadin, Eliquis, Xarelto, Lovenox, Pradaxa, Brilinta, or Effient? No  Aspirin? Yes, 81 mg  Patient confirms/reports the following medications:  Current Outpatient Medications  Medication Sig Dispense Refill  . aspirin 81 MG chewable tablet Chew by mouth daily.    Marland Kitchen atorvastatin (LIPITOR) 20 MG tablet Take 20 mg by mouth daily.    Marland Kitchen FLUoxetine (PROZAC) 40 MG capsule Take 40 mg by mouth as needed.     . metoprolol tartrate (LOPRESSOR) 25 MG tablet Take 12.5 mg by mouth 2 (two) times daily.     No current facility-administered medications for this visit.     Patient confirms/reports the following allergies:  No Known Allergies  No orders of the defined types were placed in this encounter.   AUTHORIZATION INFORMATION Primary Insurance: 1D#: Group #:  Secondary Insurance: 1D#: Group #:  SCHEDULE INFORMATION: Date: 02/14/18 Time: Location: [place]

## 2018-01-16 ENCOUNTER — Other Ambulatory Visit: Payer: Self-pay

## 2018-01-16 DIAGNOSIS — Z1211 Encounter for screening for malignant neoplasm of colon: Secondary | ICD-10-CM

## 2018-02-09 ENCOUNTER — Ambulatory Visit: Payer: Self-pay | Admitting: Podiatry

## 2018-02-14 ENCOUNTER — Ambulatory Visit: Payer: Medicare Other | Admitting: Anesthesiology

## 2018-02-14 ENCOUNTER — Encounter
Admission: RE | Disposition: A | Discharge status: Home / Self Care | Payer: Self-pay | Source: Ambulatory Visit | Attending: Gastroenterology

## 2018-02-14 ENCOUNTER — Ambulatory Visit
Admission: RE | Admit: 2018-02-14 | Discharge: 2018-02-14 | Disposition: A | Discharge status: Home / Self Care | Payer: Medicare Other | Source: Ambulatory Visit | Attending: Gastroenterology | Admitting: Gastroenterology

## 2018-02-14 ENCOUNTER — Encounter: Payer: Self-pay | Admitting: Student

## 2018-02-14 DIAGNOSIS — I10 Essential (primary) hypertension: Secondary | ICD-10-CM | POA: Insufficient documentation

## 2018-02-14 DIAGNOSIS — Z79899 Other long term (current) drug therapy: Secondary | ICD-10-CM | POA: Insufficient documentation

## 2018-02-14 DIAGNOSIS — G4730 Sleep apnea, unspecified: Secondary | ICD-10-CM | POA: Insufficient documentation

## 2018-02-14 DIAGNOSIS — Z1211 Encounter for screening for malignant neoplasm of colon: Secondary | ICD-10-CM | POA: Insufficient documentation

## 2018-02-14 DIAGNOSIS — F419 Anxiety disorder, unspecified: Secondary | ICD-10-CM | POA: Diagnosis not present

## 2018-02-14 DIAGNOSIS — D122 Benign neoplasm of ascending colon: Secondary | ICD-10-CM | POA: Diagnosis not present

## 2018-02-14 DIAGNOSIS — K640 First degree hemorrhoids: Secondary | ICD-10-CM | POA: Diagnosis not present

## 2018-02-14 DIAGNOSIS — F329 Major depressive disorder, single episode, unspecified: Secondary | ICD-10-CM | POA: Insufficient documentation

## 2018-02-14 DIAGNOSIS — Z7982 Long term (current) use of aspirin: Secondary | ICD-10-CM | POA: Diagnosis not present

## 2018-02-14 HISTORY — PX: COLONOSCOPY WITH PROPOFOL: SHX5780

## 2018-02-14 HISTORY — DX: Sleep apnea, unspecified: G47.30

## 2018-02-14 SURGERY — COLONOSCOPY WITH PROPOFOL
Anesthesia: General

## 2018-02-14 MED ORDER — PROPOFOL 10 MG/ML IV BOLUS
INTRAVENOUS | Status: DC | PRN
  Administered 2018-02-14: 90 mg via INTRAVENOUS

## 2018-02-14 MED ORDER — LIDOCAINE HCL (PF) 2 % IJ SOLN
INTRAMUSCULAR | Status: AC
  Filled 2018-02-14: qty 10

## 2018-02-14 MED ORDER — PROPOFOL 500 MG/50ML IV EMUL
INTRAVENOUS | Status: DC | PRN
  Administered 2018-02-14: 140 ug/kg/min via INTRAVENOUS

## 2018-02-14 MED ORDER — SODIUM CHLORIDE 0.9 % IV SOLN
INTRAVENOUS | Status: DC
  Administered 2018-02-14: 13:00:00 via INTRAVENOUS

## 2018-02-14 MED ORDER — PROPOFOL 500 MG/50ML IV EMUL
INTRAVENOUS | Status: AC
  Filled 2018-02-14: qty 50

## 2018-02-14 MED ORDER — LIDOCAINE HCL (CARDIAC) 20 MG/ML IV SOLN
INTRAVENOUS | Status: DC | PRN
  Administered 2018-02-14: 50 mg via INTRAVENOUS

## 2018-02-14 NOTE — H&P (Signed)
     Jonathon Bellows, MD [address] [place], [place], [place], [address] [address], [place], [place], [place], [address] Phone: [phone]  Fax: [phone]  Primary Care Physician:  Freddy Finner, NP   Pre-Procedure History & Physical: HPI:  Rachel Short is a 57 y.o. female is here for an colonoscopy.   Past Medical History:  Diagnosis Date  . Anxiety and depression   . Hypertension   . Sleep apnea     Past Surgical History:  Procedure Laterality Date  . BREAST BIOPSY Right    neg    Prior to Admission medications   Medication Sig Start Date End Date Taking? Authorizing Provider  aspirin 81 MG chewable tablet Chew by mouth daily.   Yes [provider]  metoprolol tartrate (LOPRESSOR) 25 MG tablet Take 12.5 mg by mouth 2 (two) times daily.   Yes [provider]  atorvastatin (LIPITOR) 20 MG tablet Take 20 mg by mouth daily.    [provider]  FLUoxetine (PROZAC) 40 MG capsule Take 40 mg by mouth as needed.     [provider]    Allergies as of 01/16/2018  . (No Known Allergies)    Family History  Problem Relation Age of Onset  . Breast cancer Neg Hx     Social History   Socioeconomic History  . Marital status: Married    Spouse name: Not on file  . Number of children: Not on file  . Years of education: Not on file  . Highest education level: Not on file  Social Needs  . Financial resource strain: Not on file  . Food insecurity - worry: Not on file  . Food insecurity - inability: Not on file  . Transportation needs - medical: Not on file  . Transportation needs - non-medical: Not on file  Occupational History  . Not on file  Tobacco Use  . Smoking status: Never Smoker  . Smokeless tobacco: Never Used  Substance and Sexual Activity  . Alcohol use: Yes    Comment: occasional every 3-4 months  . Drug use: No  . Sexual activity: Not on file  Other Topics Concern  . Not on file  Social History Narrative   . Not on file    Review of Systems: See HPI, otherwise negative ROS  Physical Exam: BP (!) 143/83   Pulse 80   Temp (!) 97.2 F (36.2 C) (Tympanic)   Resp 20   Ht 5\' 4"  (1.626 m)   Wt 185 lb (83.9 kg)   SpO2 96%   BMI 31.76 kg/m  General:   Alert,  pleasant and cooperative in NAD Head:  Normocephalic and atraumatic. Neck:  Supple; no masses or thyromegaly. Lungs:  Clear throughout to auscultation, normal respiratory effort.    Heart:  +S1, +S2, Regular rate and rhythm, No edema. Abdomen:  Soft, nontender and nondistended. Normal bowel sounds, without guarding, and without rebound.   Neurologic:  Alert and  oriented x4;  grossly normal neurologically.  Impression/Plan: Rachel Short is here for an colonoscopy to be performed for Screening colonoscopy average risk   Risks, benefits, limitations, and alternatives regarding  colonoscopy have been reviewed with the patient.  Questions have been answered.  All parties agreeable.   Jonathon Bellows, MD  02/14/2018, 12:27 PM

## 2018-02-14 NOTE — Anesthesia Preprocedure Evaluation (Signed)
Anesthesia Evaluation  Patient identified by MRN, date of birth, ID band Patient awake    Reviewed: Allergy & Precautions, H&P , NPO status , reviewed documented beta blocker date and time   Airway Mallampati: III  TM Distance: >3 FB     Dental  (+) Partial Upper   Pulmonary sleep apnea ,    Pulmonary exam normal        Cardiovascular hypertension, Normal cardiovascular exam     Neuro/Psych PSYCHIATRIC DISORDERS Anxiety Depression    GI/Hepatic   Endo/Other    Renal/GU      Musculoskeletal   Abdominal   Peds  Hematology   Anesthesia Other Findings Pt feels she has OSA, does not have CPAP or formal diagnosis  Reproductive/Obstetrics                             Anesthesia Physical Anesthesia Plan  ASA: II  Anesthesia Plan: General   Post-op Pain Management:    Induction:   PONV Risk Score and Plan: 3 and Propofol infusion  Airway Management Planned:   Additional Equipment:   Intra-op Plan:   Post-operative Plan:   Informed Consent: I have reviewed the patients History and Physical, chart, labs and discussed the procedure including the risks, benefits and alternatives for the proposed anesthesia with the patient or authorized representative who has indicated his/her understanding and acceptance.   Dental Advisory Given  Plan Discussed with: CRNA  Anesthesia Plan Comments:         Anesthesia Quick Evaluation

## 2018-02-14 NOTE — Op Note (Signed)
[facility] Gastroenterology Patient Name: Rachel Short Procedure Date: 02/14/2018 12:30 PM MRN: [id number] Account #: [id number] Date of Birth: 12/08/1961 Admit Type: Outpatient Age: 57 Room: [facility] ENDO ROOM 1 Gender: Female Note Status: Finalized Procedure:            Colonoscopy Indications:          Screening for colorectal malignant neoplasm Providers:            Jonathon Bellows MD, MD Referring MD:         Neomia Dear (Referring MD) Medicines:            Monitored Anesthesia Care Complications:        No immediate complications. Procedure:            Pre-Anesthesia Assessment:                       - Prior to the procedure, a History and Physical was                        performed, and patient medications, allergies and                        sensitivities were reviewed. The patient's tolerance of                        previous anesthesia was reviewed.                       - The risks and benefits of the procedure and the                        sedation options and risks were discussed with the                        patient. All questions were answered and informed                        consent was obtained.                       - ASA Grade Assessment: III - A patient with severe                        systemic disease.                       After obtaining informed consent, the colonoscope was                        passed under direct vision. Throughout the procedure,                        the patient's blood pressure, pulse, and oxygen                        saturations were monitored continuously. The                        Colonoscope was introduced through the anus and  advanced to the the cecum, identified by the                        appendiceal orifice, IC valve and transillumination.                        The colonoscopy was performed with ease. The patient                        tolerated the procedure well. The  quality of the bowel                        preparation was good. Findings:      The perianal and digital rectal examinations were normal.      A 3 mm polyp was found in the ascending colon. The polyp was sessile.       The polyp was removed with a cold biopsy forceps. Resection and       retrieval were complete.      Non-bleeding internal hemorrhoids were found during retroflexion. The       hemorrhoids were medium-sized and Grade I (internal hemorrhoids that do       not prolapse).      The exam was otherwise without abnormality on direct and retroflexion       views. Impression:           - One 3 mm polyp in the ascending colon, removed with a                        cold biopsy forceps. Resected and retrieved.                       - Non-bleeding internal hemorrhoids.                       - The examination was otherwise normal on direct and                        retroflexion views. Recommendation:       - Discharge patient to home (with escort).                       - Resume previous diet.                       - Continue present medications.                       - Await pathology results.                       - Repeat colonoscopy in 5-10 years for surveillance                        based on pathology results. Procedure Code(s):    --- Professional ---                       [id number], Colonoscopy, flexible; with biopsy, single or                        multiple Diagnosis Code(s):    --- Professional ---  Z12.11, Encounter for screening for malignant neoplasm                        of colon                       D12.2, Benign neoplasm of ascending colon                       K64.0, First degree hemorrhoids CPT copyright 2016 American Medical Association. All rights reserved. The codes documented in this report are preliminary and upon coder review may  be revised to meet current compliance requirements. Jonathon Bellows, MD Jonathon Bellows MD, MD 02/14/2018 1:15:06  PM This report has been signed electronically. Number of Addenda: 0 Note Initiated On: 02/14/2018 12:30 PM Scope Withdrawal Time: 0 hours 17 minutes 13 seconds  Total Procedure Duration: 0 hours 19 minutes 59 seconds       [facility]

## 2018-02-14 NOTE — Transfer of Care (Signed)
Immediate Anesthesia Transfer of Care Note  Patient: Rachel Short  Procedure(s) Performed: COLONOSCOPY WITH PROPOFOL (N/A )  Patient Location: PACU and Endoscopy Unit  Anesthesia Type:General  Level of Consciousness: drowsy  Airway & Oxygen Therapy: Patient Spontanous Breathing and Patient connected to nasal cannula oxygen  Post-op Assessment: Report given to RN and Post -op Vital signs reviewed and stable  Post vital signs: Reviewed and stable  Last Vitals:  Vitals:   02/14/18 1217  BP: (!) 143/83  Pulse: 80  Resp: 20  Temp: (!) 36.2 C  SpO2: 96%    Last Pain:  Vitals:   02/14/18 1217  TempSrc: Tympanic         Complications: No apparent anesthesia complications

## 2018-02-14 NOTE — Anesthesia Post-op Follow-up Note (Signed)
Anesthesia QCDR form completed.        

## 2018-02-14 NOTE — Anesthesia Postprocedure Evaluation (Signed)
Anesthesia Post Note  Patient: Rachel Short  Procedure(s) Performed: COLONOSCOPY WITH PROPOFOL (N/A )  Patient location during evaluation: Endoscopy Anesthesia Type: General Level of consciousness: awake and alert Pain management: pain level controlled Vital Signs Assessment: post-procedure vital signs reviewed and stable Respiratory status: spontaneous breathing, nonlabored ventilation, respiratory function stable and patient connected to nasal cannula oxygen Cardiovascular status: blood pressure returned to baseline and stable Postop Assessment: no apparent nausea or vomiting Anesthetic complications: no     Last Vitals:  Vitals:   02/14/18 1342 02/14/18 1352  BP: (!) 124/91 (!) 135/93  Pulse:    Resp:    Temp:    SpO2:      Last Pain:  Vitals:   02/14/18 1322  TempSrc: Tympanic                 Tahni Porchia Harvie Heck

## 2018-02-15 ENCOUNTER — Encounter: Payer: Self-pay | Admitting: Gastroenterology

## 2018-02-15 LAB — SURGICAL PATHOLOGY

## 2018-02-16 ENCOUNTER — Encounter: Payer: Self-pay | Admitting: Gastroenterology

## 2018-02-22 ENCOUNTER — Encounter: Payer: Self-pay | Admitting: Podiatry

## 2018-02-22 ENCOUNTER — Ambulatory Visit: Payer: Medicare Other | Admitting: Podiatry

## 2018-02-22 DIAGNOSIS — L600 Ingrowing nail: Secondary | ICD-10-CM

## 2018-02-22 DIAGNOSIS — B351 Tinea unguium: Secondary | ICD-10-CM

## 2018-02-22 LAB — HEPATIC FUNCTION PANEL
AG RATIO: 1.5 (calc) (ref 1.0–2.5)
ALT: 16 U/L (ref 6–29)
AST: 14 U/L (ref 10–35)
Albumin: 4 g/dL (ref 3.6–5.1)
Alkaline phosphatase (APISO): 120 U/L (ref 33–130)
BILIRUBIN DIRECT: 0.1 mg/dL (ref 0.0–0.2)
GLOBULIN: 2.7 g/dL (ref 1.9–3.7)
Indirect Bilirubin: 0.4 mg/dL (calc) (ref 0.2–1.2)
Total Bilirubin: 0.5 mg/dL (ref 0.2–1.2)
Total Protein: 6.7 g/dL (ref 6.1–8.1)

## 2018-02-22 MED ORDER — TERBINAFINE HCL 250 MG PO TABS: 250.0000 mg | ORAL_TABLET | Freq: Every day | ORAL | 0 refills | Status: AC

## 2018-02-22 NOTE — Progress Notes (Signed)
   Subjective:    Patient ID: Rachel Short, female    DOB: 1961-11-18, 57 y.o.   MRN: [id number]  HPI    Review of Systems  All other systems reviewed and are negative.      Objective:   Physical Exam        Assessment & Plan:

## 2018-02-22 NOTE — Progress Notes (Signed)
Subjective:   Patient ID: Rachel Short, female   DOB: 57 y.o.   MRN: [id number]   HPI Patient presents with nail disease mostly of the right big toenail but several other nails that is aggravating and making it hard at times to wear shoe comfortably.  Patient states that she does not like it and she wants to do anything to get it better   Review of Systems  All other systems reviewed and are negative.       Objective:  Physical Exam  Constitutional: She appears well-developed and well-nourished.  Cardiovascular: Intact distal pulses.  Pulmonary/Chest: Effort normal.  Musculoskeletal: Normal range of motion.  Neurological: She is alert.  Skin: Skin is warm.  Nursing note and vitals reviewed.   Neurovascular status intact muscle strength is adequate range of motion within normal limits with patient found to have thickened damaged nail right hallux with several nails on the right foot being discolored and thickened.  The left has several but not to the same degree and there is a mild odor to the right hallux nail.  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  She wants to take an aggressive approach and I recommended a combination of oral medication Lamisil which I explained risk of along with laser therapy.  Patient is scheduled for laser which will mostly focus on the right hallux nail with several other nail involvement.  Patient will have liver function studies done and prescription for Lamisil 250 mg daily for 90 days was written today.  Reappoint for laser

## 2018-02-28 ENCOUNTER — Ambulatory Visit: Payer: Self-pay

## 2018-02-28 DIAGNOSIS — B351 Tinea unguium: Secondary | ICD-10-CM

## 2018-03-01 ENCOUNTER — Telehealth: Payer: Self-pay | Admitting: *Deleted

## 2018-03-01 NOTE — Telephone Encounter (Signed)
I did the laser yesterday and the number of shocks was the start [id number] and the finish was [id number]. Rachel Short

## 2018-03-06 NOTE — Progress Notes (Signed)
Pt presents with mycotic infection of nails 1-5 bilateral  All other systems are negative  Laser therapy administered to affected nails and tolerated well. All safety precautions were in place. Re-appointed in 4 weeks for 2nd treatment 

## 2018-03-28 ENCOUNTER — Other Ambulatory Visit: Payer: Medicare Other

## 2018-03-28 NOTE — Addendum Note (Signed)
Addendum  created 03/28/18 1347 by Alphonsus Sias, MD   Attestation recorded in [place], [place] filed

## 2018-04-09 ENCOUNTER — Other Ambulatory Visit: Payer: Medicare Other

## 2018-04-25 ENCOUNTER — Other Ambulatory Visit: Payer: Medicare Other

## 2018-12-04 ENCOUNTER — Other Ambulatory Visit: Payer: Self-pay | Admitting: Primary Care

## 2018-12-04 DIAGNOSIS — Z1231 Encounter for screening mammogram for malignant neoplasm of breast: Secondary | ICD-10-CM

## 2018-12-20 ENCOUNTER — Ambulatory Visit
Admission: RE | Admit: 2018-12-20 | Discharge: 2018-12-20 | Disposition: A | Discharge status: Home / Self Care | Payer: Medicare Other | Source: Ambulatory Visit | Attending: Primary Care | Admitting: Primary Care

## 2018-12-20 DIAGNOSIS — Z1231 Encounter for screening mammogram for malignant neoplasm of breast: Secondary | ICD-10-CM | POA: Diagnosis present

## 2019-10-11 ENCOUNTER — Other Ambulatory Visit: Admission: RE | Admit: 2019-10-11 | Payer: Medicare Other | Source: Ambulatory Visit

## 2019-10-15 ENCOUNTER — Ambulatory Visit: Payer: Medicare Other | Attending: Neurology

## 2019-12-05 ENCOUNTER — Ambulatory Visit
Admission: RE | Admit: 2019-12-05 | Discharge: 2019-12-05 | Disposition: A | Discharge status: Home / Self Care | Payer: Medicare Other | Source: Ambulatory Visit | Attending: Primary Care | Admitting: Primary Care

## 2019-12-05 ENCOUNTER — Other Ambulatory Visit: Payer: Self-pay | Admitting: Primary Care

## 2019-12-05 DIAGNOSIS — M542 Cervicalgia: Secondary | ICD-10-CM | POA: Insufficient documentation

## 2020-06-08 ENCOUNTER — Ambulatory Visit: Payer: Medicare Other | Admitting: Dermatology

## 2020-08-12 ENCOUNTER — Ambulatory Visit: Payer: Medicare Other | Attending: Neurology

## 2020-08-12 DIAGNOSIS — G4733 Obstructive sleep apnea (adult) (pediatric): Secondary | ICD-10-CM | POA: Diagnosis not present

## 2020-08-13 ENCOUNTER — Other Ambulatory Visit: Payer: Self-pay

## 2020-12-10 ENCOUNTER — Other Ambulatory Visit: Payer: Self-pay | Admitting: Primary Care

## 2020-12-10 DIAGNOSIS — Z1231 Encounter for screening mammogram for malignant neoplasm of breast: Secondary | ICD-10-CM

## 2021-06-02 ENCOUNTER — Encounter: Payer: Self-pay | Admitting: Dermatology

## 2021-06-02 ENCOUNTER — Other Ambulatory Visit: Payer: Self-pay

## 2021-06-02 ENCOUNTER — Ambulatory Visit: Payer: Medicare Other | Admitting: Dermatology

## 2021-06-02 DIAGNOSIS — L219 Seborrheic dermatitis, unspecified: Secondary | ICD-10-CM | POA: Diagnosis not present

## 2021-06-02 DIAGNOSIS — L918 Other hypertrophic disorders of the skin: Secondary | ICD-10-CM | POA: Diagnosis not present

## 2021-06-02 DIAGNOSIS — L811 Chloasma: Secondary | ICD-10-CM

## 2021-06-02 MED ORDER — FLUOCINOLONE ACETONIDE SCALP 0.01 % EX OIL: TOPICAL_OIL | CUTANEOUS | 3 refills | Status: DC

## 2021-06-02 NOTE — Patient Instructions (Addendum)
Recommend Avlon Keracare anti-dandruff shampoo and conditioner at least once weekly, leaving shampoo on for about 10 minutes before rinsing and conditioning.   Start Fluocinolone scalp oil once daily to ears and scalp as needed only  Start [facility] Hydroquinone 8%, Tretinoin 0.1%, Kojic acid 1%, Niacinamide 4%, Fluocinolone 0.025% cream, a pea sized amount nightly to dark spots on face. This cannot be used more than 3 months due to risk of skin atrophy (thinning) and exogenous ochronosis (permanent dark spots). The patient was advised this is not covered by insurance. They will receive an email to check out and the medication will be mailed to their home.  Instructions for [facility] Medications  One or more of your medications was sent to the [facility] mail order compounding pharmacy. You will receive an email from them and can purchase the medicine through that link. It will then be mailed to your home at the address you confirmed. If for any reason you do not receive an email from them, please check your spam folder. If you still do not find the email, please let us know. [facility] phone number is [phone].  Recommend Alastin sunscreen or EltaMD UV Clear TINTED sunscreen.  Can try Black Girl Sunscreen.

## 2021-06-02 NOTE — Progress Notes (Signed)
   Office Visit  Subjective  Rachel Short is a 60 y.o. female who presents for the following: Skin Problem (Patient c/o dark spots on face. Dur: years. C/O dry skin in ears. ) and Skin Tag (Neck. Patient states lesions are tender to the touch. Rubbed, irritated by clothing. Patient has degenerative disc disease and her neck is bothered by these tags. ).   Review of Systems: No other skin or systemic complaints except as noted in HPI or Assessment and Plan.   Objective  Well appearing patient in no apparent distress; mood and affect are within normal limits.  A focused examination was performed including face, ears, neck. Relevant physical exam findings are noted in the Assessment and Plan.  B/L temples/cheeks Hyperpigmented patches  ears, scalp Pink patches with greasy scale.   neck and B/L axillae Fleshy, skin-colored pedunculated papules.    Assessment & Plan  Melasma B/L temples/cheeks  Chronic condition with duration or expected duration over one year. Condition is bothersome to patient. Currently flared.  Melasma is a condition of persistent pigmented patches generally on the face, worse in summer due to higher UV exposure.  Oral estrogen containing BCPs or supplements can exacerbate condition.  Recommend daily broad spectrum tinted sunscreen SPF 30+ to face, preferably with Zinc or Titanium Dioxide. Discussed Rx topical bleaching creams (i.e. hydroquinone), OTC HelioCare supplement, chemical peels (would need multiple for best result).   Start prescription [facility] Hydroquinone 8%, Tretinoin 0.1%, Kojic acid 1%, Niacinamide 4%, Fluocinolone 0.025% cream, a pea sized amount nightly to dark spots on face for up to 2 months. This cannot be used more than 3 months due to risk of skin atrophy (thinning) and exogenous ochronosis (permanent dark spots). The patient was advised this is not covered by insurance. They will receive an email to check out and the medication will  be mailed to their home.   If not improving in 1 month call office. Will change back to original Rx from [facility] (Hydroquinone 12%/kojic acid/vitamin C cream)  Seborrheic dermatitis ears, scalp  Chronic condition with duration or expected duration over one year. Condition is bothersome to patient. Currently flared.  Recommend Avlon Keracare anti-dandruff shampoo and conditioner at least once weekly, leaving shampoo on for about 10 minutes before rinsing and conditioning.   Start Fluocinolone scalp oil QD to ears and scalp PRN    Fluocinolone Acetonide Scalp 0.01 % OIL - ears, scalp Apply QD to scalp and ears PRN only  Skin tag neck and B/L axillae  Benign-appearing.  Observation.  Call clinic for new or changing lesions.    Advised removal considered cosmetic - $115 to remove up to 15 lesions.   Return for recheck in 2-3 months.   I, Emelia Salisbury, CMA, am acting as scribe for Forest Gleason, MD.  Documentation: I have reviewed the above documentation for accuracy and completeness, and I agree with the above.  Forest Gleason, MD

## 2021-06-14 ENCOUNTER — Encounter: Payer: Self-pay | Admitting: Dermatology

## 2021-09-01 ENCOUNTER — Ambulatory Visit: Payer: Medicare Other | Admitting: Dermatology

## 2021-09-07 ENCOUNTER — Ambulatory Visit: Payer: Medicare Other | Admitting: Dermatology

## 2021-10-26 IMAGING — CR DG CERVICAL SPINE COMPLETE 4+V
6 series · 6 of 6 positions shown · non-contrast
Comparison: None.

CLINICAL DATA: Bilateral neck pain

EXAM:
CERVICAL SPINE - COMPLETE 4+ VIEW

[c-spine lat]
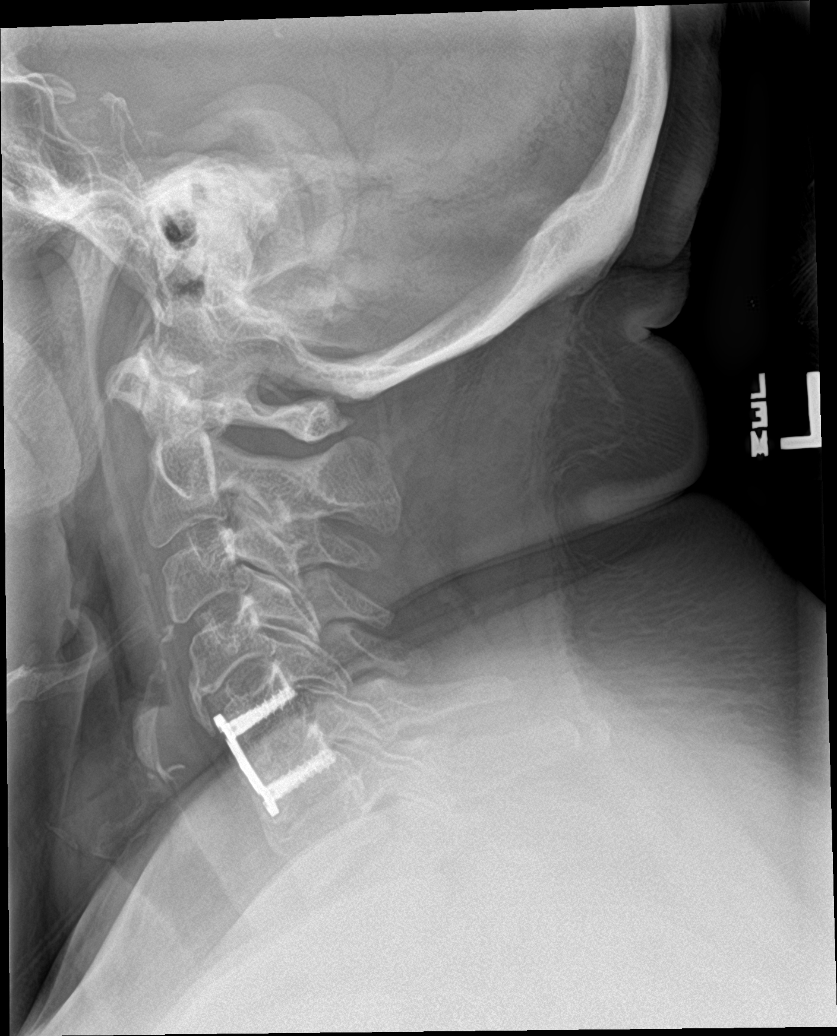

[c-spine obl (1 of 2)]
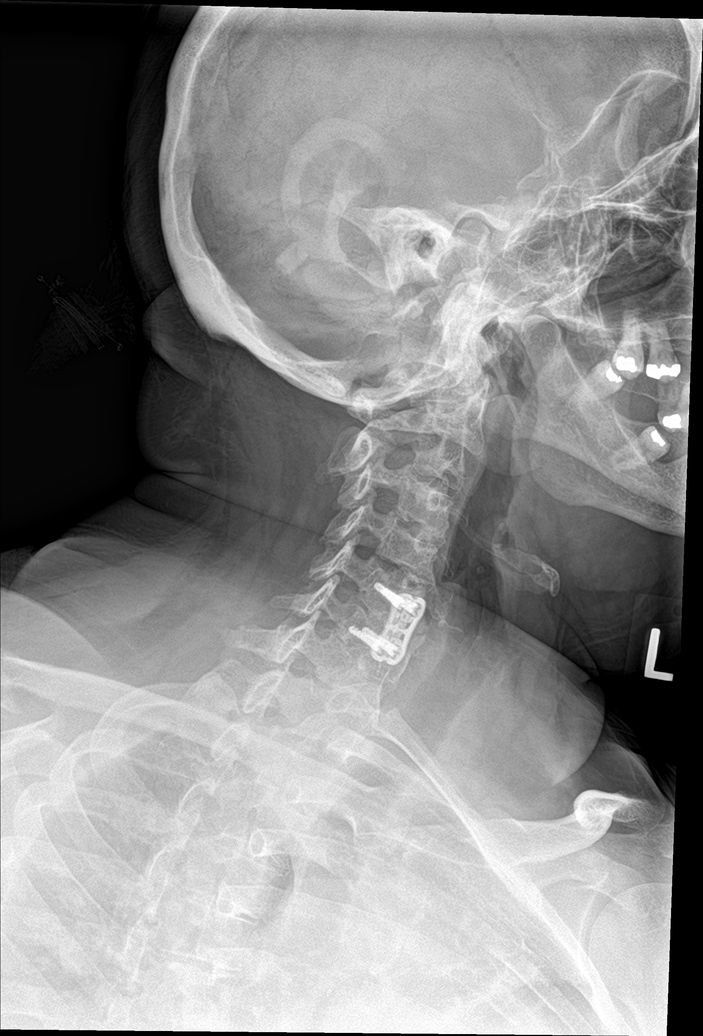

[c-spine obl (2 of 2)]
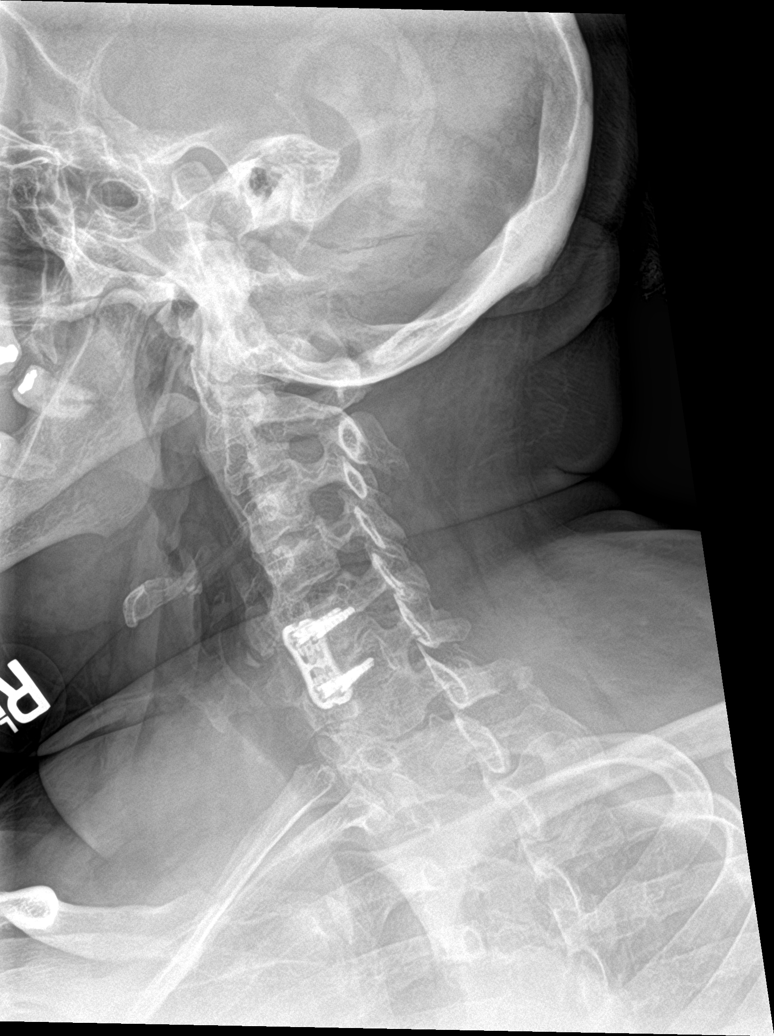

[c-spine ap]
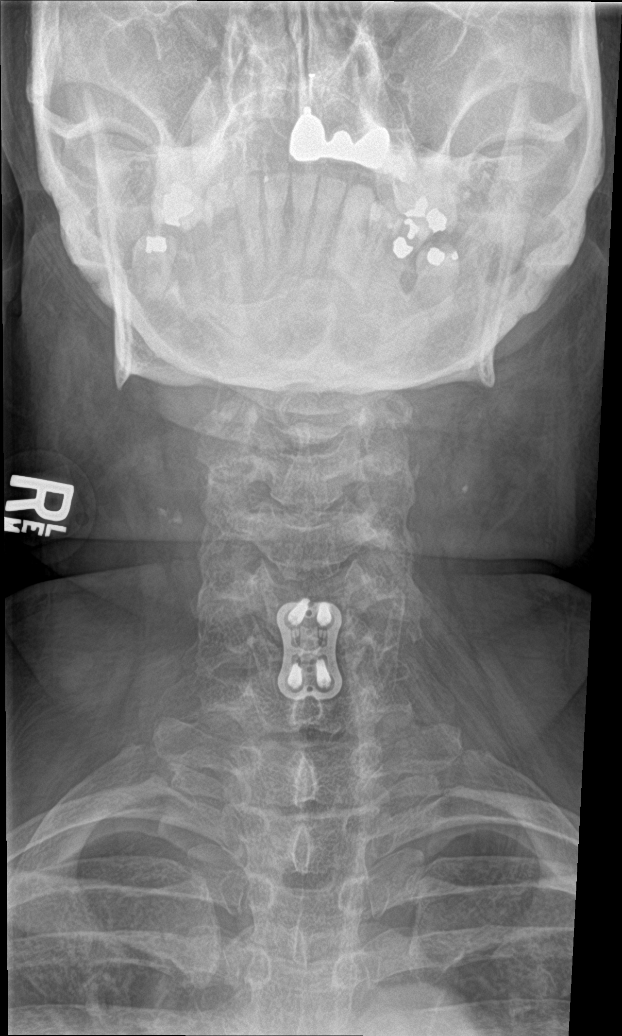

[c-spine open mouth]
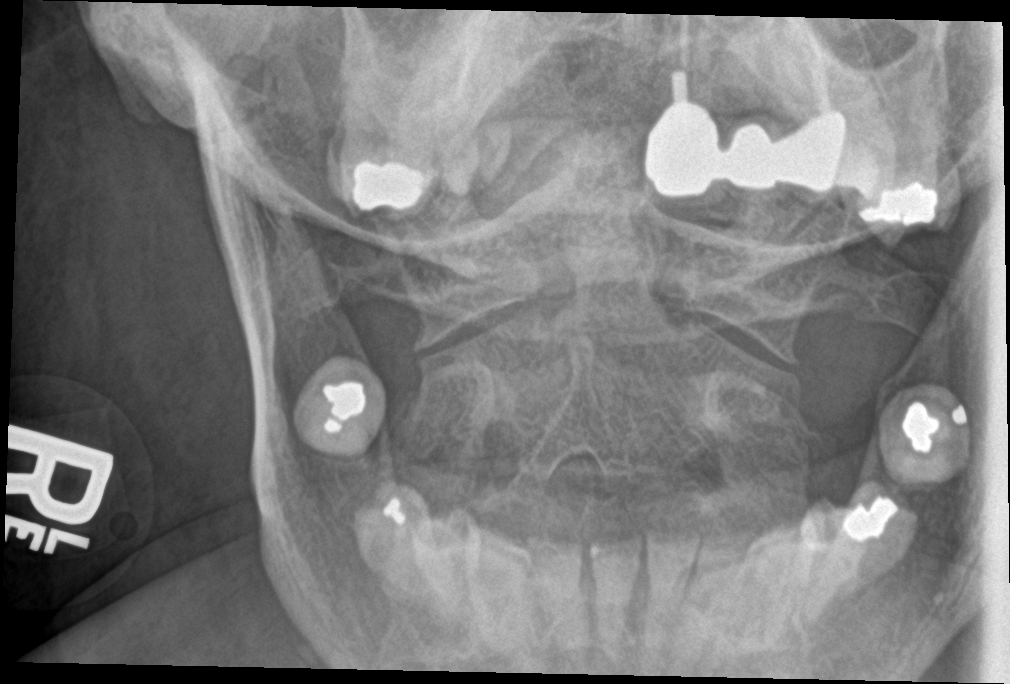

[c-spine swimmers]
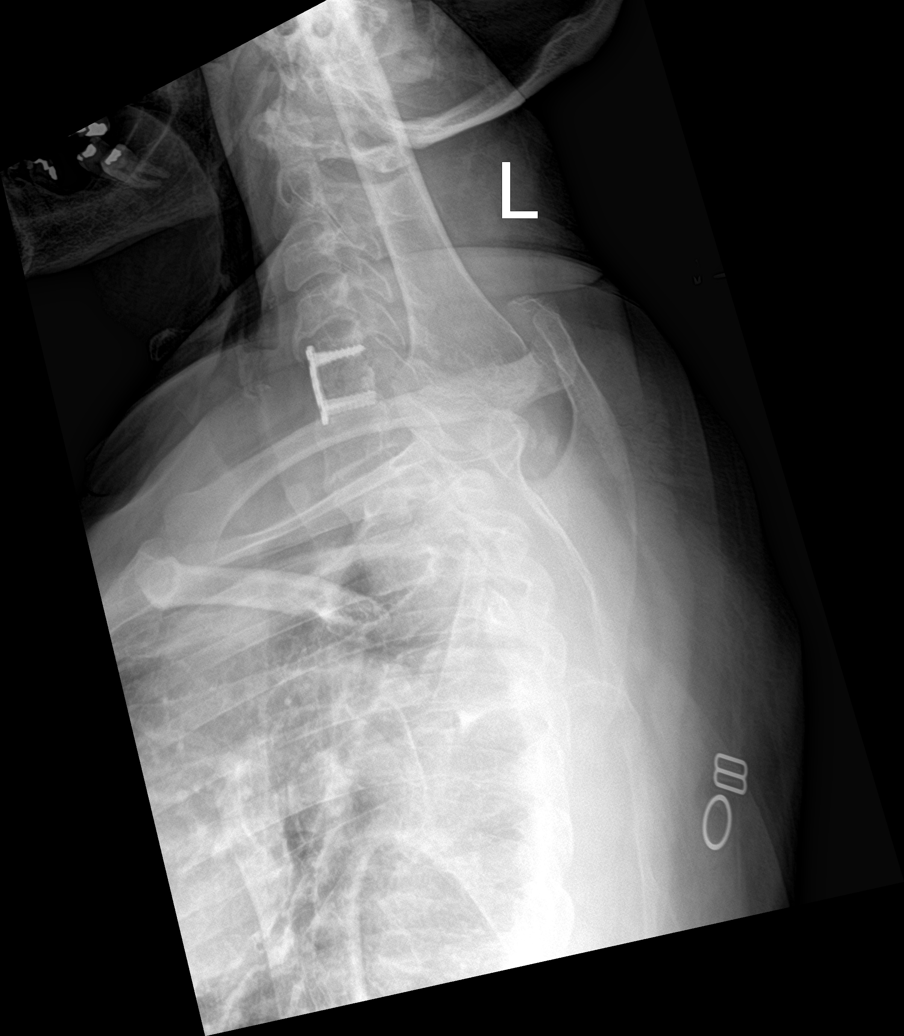

[6 of 6 positions shown; findings below may reference images not displayed]

FINDINGS: Straightening of the cervical lordosis. There is anterior fusion at
C5-C6 with plate and screw fixation and interbody graft. No
prevertebral soft tissue swelling. Multilevel disc height loss.
Facet and uncovertebral hypertrophy are present with mild
encroachment on the neural foramina.
IMPRESSION: Mild to moderate cervical spondylosis.

## 2021-12-13 ENCOUNTER — Ambulatory Visit: Payer: Medicare Other | Admitting: Dermatology

## 2021-12-15 ENCOUNTER — Ambulatory Visit: Payer: Medicare Other | Admitting: Dermatology

## 2021-12-22 ENCOUNTER — Ambulatory Visit: Payer: Medicare Other | Admitting: Dermatology

## 2022-01-25 ENCOUNTER — Encounter: Payer: Self-pay | Admitting: Dermatology

## 2022-01-25 ENCOUNTER — Ambulatory Visit: Payer: Medicare Other | Admitting: Dermatology

## 2022-01-25 ENCOUNTER — Other Ambulatory Visit: Payer: Self-pay

## 2022-01-25 DIAGNOSIS — L811 Chloasma: Secondary | ICD-10-CM | POA: Diagnosis not present

## 2022-01-25 DIAGNOSIS — B079 Viral wart, unspecified: Secondary | ICD-10-CM

## 2022-01-25 DIAGNOSIS — D485 Neoplasm of uncertain behavior of skin: Secondary | ICD-10-CM

## 2022-01-25 DIAGNOSIS — L219 Seborrheic dermatitis, unspecified: Secondary | ICD-10-CM

## 2022-01-25 MED ORDER — FLUOCINOLONE ACETONIDE SCALP 0.01 % EX OIL: TOPICAL_OIL | CUTANEOUS | 5 refills | Status: AC

## 2022-01-25 NOTE — Progress Notes (Signed)
Follow-Up Visit   Subjective  Rachel Short is a 61 y.o. female who presents for the following: Follow-up (F/u for melasma. Pt has stopped using the cream from [facility] - states that it was not helping. Also f/u for seb derm to scalp and ears. Pt treating with fluocinolone oil to ears QD PRN - she states that it doesn't help much. ).    The following portions of the chart were reviewed this encounter and updated as appropriate:  Tobacco   Allergies   Meds   Problems   Med Hx   Surg Hx   Fam Hx       Review of Systems: No other skin or systemic complaints.  Objective  Well appearing patient in no apparent distress; mood and affect are within normal limits.  A focused examination was performed including scalp, face, neck. Relevant physical exam findings are noted in the Assessment and Plan.  Head - Anterior (Face) Reticulated hyperpigmented patches.   Scalp Pink patches with greasy scale.   Left neck anterior 0.4 cm verrucous papule      Left neck posterior 0.3 cm verrucous papule      Assessment & Plan  Melasma Head - Anterior (Face)  Will prescribe [facility] Hydroquinone 12%/kojic acid/vitamin C cream pea sized amount twice daily to the entire face for up to 3 months. This cannot be used more than 3 months due to risk of exogenous ochronosis (permanent dark spots).   Chronic and persistent condition with duration or expected duration over one year. Condition is bothersome/symptomatic for patient. Currently flared.  Melasma is a chronic condition of persistent pigmented patches generally on the face, worse in summer due to higher UV exposure.  Oral estrogen containing BCPs or supplements can exacerbate condition.  Recommend daily broad spectrum tinted sunscreen SPF 30+ to face, preferably with Zinc or Titanium Dioxide. Discussed Rx topical bleaching creams (i.e. hydroquinone), OTC HelioCare supplement, chemical peels (would need multiple for best  result).    Seborrheic dermatitis Scalp  Recommend Avlon Keracare anti-dandruff shampoo and conditioner at least once weekly, leaving shampoo on for about 10 minutes before rinsing and conditioning.   Recommend using the fluocinolone oil QD when flared If too oily pt will call and switch to solution.   Chronic and persistent condition with duration or expected duration over one year. Condition is symptomatic/ bothersome to patient. Not currently at goal.   Related Medications Fluocinolone Acetonide Scalp 0.01 % OIL Apply QD to scalp and ears PRN only  Neoplasm of uncertain behavior of skin (2) Left neck anterior  Epidermal / dermal shaving  Lesion diameter (cm):  0.4 Informed consent: discussed and consent obtained   Patient was prepped and draped in usual sterile fashion: Area prepped with alcohol. Anesthesia: the lesion was anesthetized in a standard fashion   Anesthetic:  1% lidocaine w/ epinephrine 1-100,000 buffered w/ 8.4% NaHCO3 Instrument used: flexible razor blade   Hemostasis achieved with: pressure, aluminum chloride and electrodesiccation   Outcome: patient tolerated procedure well   Post-procedure details: wound care instructions given   Post-procedure details comment:  Ointment and small bandage applied  Specimen 1 - Surgical pathology Differential Diagnosis: R/o verruca vs other  Check Margins: No  Left neck posterior  Epidermal / dermal shaving  Lesion diameter (cm):  0.3 Informed consent: discussed and consent obtained   Patient was prepped and draped in usual sterile fashion: Area prepped with alcohol. Anesthesia: the lesion was anesthetized in a standard fashion  Anesthetic:  1% lidocaine w/ epinephrine 1-100,000 buffered w/ 8.4% NaHCO3 Instrument used: flexible razor blade   Hemostasis achieved with: pressure, aluminum chloride and electrodesiccation   Outcome: patient tolerated procedure well   Post-procedure details: wound care instructions  given   Post-procedure details comment:  Ointment and small bandage applied  Specimen 2 - Surgical pathology Differential Diagnosis: R/o verruca vs other  Check Margins: No   Return in about 4 months (around 05/26/2022) for seb derm and melasma.  I, Harriett Sine, scribed for Alfonso Patten, MD.  Documentation: I have reviewed the above documentation for accuracy and completeness, and I agree with the above.  Forest Gleason, MD

## 2022-01-25 NOTE — Patient Instructions (Addendum)
For scalp:  Recommend Avlon Keracare anti-dandruff shampoo and conditioner at least once weekly, leaving shampoo on for about 10 minutes before rinsing and conditioning. Ideally use every time you shampoo.  Use fluocinolone oil to scalp once to twice a day until scalp is better (not itching or scaly). Then just use as needed when the dandruff flares. Avoid applying to face, groin, and axilla.     For melasma:  Use daily tinted mineral sunscreen at least SPF 30 to face  Some Recommended Sunscreens Include:  Face Sunscreen Available in Different Tints Colorescience Sunforgettable Total Protection Brush on Shield  bareMinerals Complexion Rescue Tinted Hydrating Gel Cream Broad Spectrum SPF 30 UnSun mineral tinted (comes in medium/dark and light/medium)  Tinted Face Sunscreen Alastin Hydratint (good for most skin tones, may be slightly dark if you are very fair) Colorescience Sunforgettable Total Protection Face Shield (good for most skin tones) EltaMD UV Physical La Roche Posay Mineral Tinted Cotz Flawless Complexion   Powder Sunscreen (Nice for reapplying or applying on the go) Colorescience Sunforgettable Total Protection Brush on Shield (available in different tints)  Recommend taking Heliocare sun protection supplement daily in sunny weather for additional sun protection. For maximum protection on the sunniest days, you can take up to 2 capsules of regular Heliocare OR take 1 capsule of Heliocare Ultra. For prolonged exposure (such as a full day in the sun), you can repeat your dose of the supplement 4 hours after your first dose. Heliocare can be purchased at [employer], at some [facility] or at [email or web address].   Will prescribe [facility] Hydroquinone 12%/kojic acid/vitamin C cream pea sized amount twice daily to the entire face for up to 3 months. This cannot be used more than 3 months due to risk of exogenous ochronosis (permanent dark spots).     Wound Care  Instructions  Cleanse wound gently with soap and water once a day then pat dry with clean gauze. Apply a thing coat of Petrolatum (petroleum jelly, "Vaseline") over the wound (unless you have an allergy to this). We recommend that you use a new, sterile tube of Vaseline. Do not pick or remove scabs. Do not remove the yellow or white "healing tissue" from the base of the wound.  Cover the wound with fresh, clean, nonstick gauze and secure with paper tape. You may use Band-Aids in place of gauze and tape if the would is small enough, but would recommend trimming much of the tape off as there is often too much. Sometimes Band-Aids can irritate the skin.  You should call the office for your biopsy report after 1 week if you have not already been contacted.  If you experience any problems, such as abnormal amounts of bleeding, swelling, significant bruising, significant pain, or evidence of infection, please call the office immediately.  FOR ADULT SURGERY PATIENTS: If you need something for pain relief you may take 1 extra strength Tylenol (acetaminophen) AND 2 Ibuprofen (200mg  each) together every 4 hours as needed for pain. (do not take these if you are allergic to them or if you have a reason you should not take them.) Typically, you may only need pain medication for 1 to 3 days.     If You Need Anything After Your Visit  If you have any questions or concerns for your doctor, please call our main line at [phone] and press option 4 to reach your doctor's medical assistant. If no one answers, please leave a voicemail as directed and we will return  your call as soon as possible. Messages left after 4 pm will be answered the following business day.   You may also send [place] a message via [place]. We typically respond to MyChart messages within 1-2 business days.  For prescription refills, please ask your pharmacy to contact our office. Our fax number is [phone].  If you have an urgent issue  when the clinic is closed that cannot wait until the next business day, you can page your doctor at the number below.    Please note that while we do our best to be available for urgent issues outside of office hours, we are not available 24/7.   If you have an urgent issue and are unable to reach [place], you may choose to seek medical care at your doctor's office, retail clinic, urgent care center, or emergency room.  If you have a medical emergency, please immediately call 911 or go to the emergency department.  Pager Numbers  - Dr. Nehemiah Massed: [phone]  - Dr. Laurence Ferrari: [phone]  - Dr. Nicole Kindred: [phone]  In the event of inclement weather, please call our main line at [phone] for an update on the status of any delays or closures.  Dermatology Medication Tips: Please keep the boxes that topical medications come in in order to help keep track of the instructions about where and how to use these. Pharmacies typically print the medication instructions only on the boxes and not directly on the medication tubes.   If your medication is too expensive, please contact our office at [phone] option 4 or send [place] a message through [place].   We are unable to tell what your co-pay for medications will be in advance as this is different depending on your insurance coverage. However, we may be able to find a substitute medication at lower cost or fill out paperwork to get insurance to cover a needed medication.   If a prior authorization is required to get your medication covered by your insurance company, please allow [place] 1-2 business days to complete this process.  Drug prices often vary depending on where the prescription is filled and some pharmacies may offer cheaper prices.  The website www.goodrx.com contains coupons for medications through different pharmacies. The prices here do not account for what the cost may be with help from insurance (it may be cheaper with your insurance),  but the website can give you the price if you did not use any insurance.  - You can print the associated coupon and take it with your prescription to the pharmacy.  - You may also stop by our office during regular business hours and pick up a GoodRx coupon card.  - If you need your prescription sent electronically to a different pharmacy, notify our office through [facility] or by phone at [phone] option 4.     Si Usted Necesita Algo Despus de Su Visita  Tambin puede enviarnos un mensaje a travs de Pharmacist, community. Por lo general respondemos a los mensajes de MyChart en el transcurso de 1 a 2 das hbiles.  Para renovar recetas, por favor pida a su farmacia que se ponga en contacto con nuestra oficina. Harland Dingwall de fax es [place] [phone].  Si tiene un asunto urgente cuando la clnica est cerrada y que no puede esperar hasta el siguiente da hbil, puede llamar/localizar a su doctor(a) al nmero que aparece a continuacin.   Por favor, tenga en cuenta que aunque hacemos todo lo posible para estar disponibles para asuntos urgentes  fuera del horario de oficina, no estamos disponibles las 24 horas del da, los 7 das de la [place].   Si tiene un problema urgente y no puede comunicarse con nosotros, puede optar por buscar atencin mdica  en el consultorio de su doctor(a), en una clnica privada, en un centro de atencin urgente o en una sala de emergencias.  Si tiene Engineering geologist, por favor llame inmediatamente al 911 o vaya a la sala de emergencias.  Nmeros de bper  - Dr. Nehemiah Massed: [phone]  - Dra. Moye: [phone]  - Dra. Nicole Kindred: [phone]  En caso de inclemencias del [place], por favor llame a Johnsie Kindred principal al [phone] para una actualizacin sobre el [place] de cualquier retraso o cierre.  Consejos para la medicacin en dermatologa: Por favor, guarde las cajas en las que vienen los medicamentos de uso tpico para ayudarle a seguir las  instrucciones sobre dnde y cmo usarlos. Las farmacias generalmente imprimen las instrucciones del medicamento slo en las cajas y no directamente en los tubos del [place].   Si su medicamento es muy caro, por favor, pngase en contacto con Zigmund Daniel llamando al [phone] y presione la opcin 4 o envenos un mensaje a travs de Pharmacist, community.   No podemos decirle cul ser su copago por los medicamentos por adelantado ya que esto es diferente dependiendo de la cobertura de su seguro. Sin embargo, es posible que podamos encontrar un medicamento sustituto a Electrical engineer un formulario para que el seguro cubra el medicamento que se considera necesario.   Si se requiere una autorizacin previa para que su compaa de seguros [place] su medicamento, por favor permtanos de 1 a 2 das hbiles para completar este proceso.  Los precios de los medicamentos varan con frecuencia dependiendo del Environmental consultant de dnde se surte la receta y alguna farmacias pueden ofrecer precios ms baratos.  El sitio web www.goodrx.com tiene cupones para medicamentos de Airline pilot. Los precios aqu no tienen en cuenta lo que podra costar con la ayuda del seguro (puede ser ms barato con su seguro), pero el sitio web puede darle el precio si no utiliz Research scientist (physical sciences).  - Puede imprimir el cupn correspondiente y llevarlo con su receta a la farmacia.  - Tambin puede pasar por nuestra oficina durante el horario de atencin regular y Charity fundraiser una tarjeta de cupones de GoodRx.  - Si necesita que su receta se enve electrnicamente a una farmacia diferente, informe a nuestra oficina a travs de MyChart de [place] o por telfono llamando al [phone] y presione la opcin 4.

## 2022-01-25 NOTE — Progress Notes (Signed)
Entered in error

## 2022-01-26 ENCOUNTER — Telehealth: Payer: Self-pay

## 2022-01-26 NOTE — Telephone Encounter (Signed)
Advised pt of bx results/sh ?

## 2022-01-26 NOTE — Telephone Encounter (Signed)
-----   Message from Alfonso Patten, MD sent at 01/26/2022  5:08 PM EST ----- 1. Skin , left neck anterior VERRUCA VULGARIS, IRRITATED 2. Skin , left neck posterior VERRUCA VULGARIS, IRRITATED  This is a WART caused by the human papilloma virus. It is not dangerous but is contagious and can spread to other areas of skin or other people if it is not completely gone. No additional treatment is needed. However, if it comes back, we can treat with blister beetle juice in the office or you could also treat it at home with an over the counter salicylic wart treatment (slower).   MAs please call. Thank you!

## 2022-02-02 ENCOUNTER — Encounter: Payer: Self-pay | Admitting: Dermatology

## 2022-05-26 ENCOUNTER — Ambulatory Visit: Payer: Medicare Other | Admitting: Dermatology

## 2022-05-26 DIAGNOSIS — L820 Inflamed seborrheic keratosis: Secondary | ICD-10-CM

## 2022-05-26 DIAGNOSIS — L219 Seborrheic dermatitis, unspecified: Secondary | ICD-10-CM

## 2022-05-26 DIAGNOSIS — L811 Chloasma: Secondary | ICD-10-CM

## 2022-05-26 MED ORDER — TRANEXAMIC ACID 650 MG PO TABS: ORAL_TABLET | ORAL | 3 refills | Status: AC

## 2022-05-26 MED ORDER — KETOCONAZOLE 2 % EX SHAM: 1.0000 "application " | MEDICATED_SHAMPOO | Freq: Every day | CUTANEOUS | 11 refills | Status: AC

## 2022-05-26 NOTE — Patient Instructions (Addendum)
For itchy scaly spots at scalp, ears, and eyebrows  Start Ketoconazole Shampoo - use daily to affected areas massage and let sit for several minutes before rinsing out.  Continue fluocinolone oil to affected areas 3 x weekly Monday Wednesday, Friday use at bedtime.  For Melasma   Hold off using  Skin Medicinals cream for now  Be good with sun protection   Recommend daily broad spectrum sunscreen SPF 30+ to sun-exposed areas, reapply every 2 hours as needed. Call for new or changing lesions.  Staying in the shade or wearing long sleeves, sun glasses (UVA+UVB protection) and wide brim hats (4-inch brim around the entire circumference of the hat) are also recommended for sun protection.   Start Transexamic acid 650 mg tab. Take 1 pill daily.    Seborrheic Keratosis vs wart   What causes seborrheic keratoses? Seborrheic keratoses are harmless, common skin growths that first appear during adult life.  As time goes by, more growths appear.  Some people may develop a large number of them.  Seborrheic keratoses appear on both covered and uncovered body parts.  They are not caused by sunlight.  The tendency to develop seborrheic keratoses can be inherited.  They vary in color from skin-colored to gray, brown, or even black.  They can be either smooth or have a rough, warty surface.   Seborrheic keratoses are superficial and look as if they were stuck on the skin.  Under the microscope this type of keratosis looks like layers upon layers of skin.  That is why at times the top layer may seem to fall off, but the rest of the growth remains and re-grows.    Treatment Seborrheic keratoses do not need to be treated, but can easily be removed in the office.  Seborrheic keratoses often cause symptoms when they rub on clothing or jewelry.  Lesions can be in the way of shaving.  If they become inflamed, they can cause itching, soreness, or burning.  Removal of a seborrheic keratosis can be accomplished by  freezing, burning, or surgery. If any spot bleeds, scabs, or grows rapidly, please return to have it checked, as these can be an indication of a skin cancer.   Cryotherapy Aftercare  Wash gently with soap and water everyday.   Apply Vaseline and Band-Aid daily until healed.     If You Need Anything After Your Visit  If you have any questions or concerns for your doctor, please call our main line at [phone] and press option 4 to reach your doctor's medical assistant. If no one answers, please leave a voicemail as directed and we will return your call as soon as possible. Messages left after 4 pm will be answered the following business day.   You may also send [place] a message via [place]. We typically respond to MyChart messages within 1-2 business days.  For prescription refills, please ask your pharmacy to contact our office. Our fax number is [phone].  If you have an urgent issue when the clinic is closed that cannot wait until the next business day, you can page your doctor at the number below.    Please note that while we do our best to be available for urgent issues outside of office hours, we are not available 24/7.   If you have an urgent issue and are unable to reach [place], you may choose to seek medical care at your doctor's office, retail clinic, urgent care center, or emergency room.  If you have a  medical emergency, please immediately call 911 or go to the emergency department.  Pager Numbers  - Dr. Nehemiah Massed: [phone]  - Dr. Laurence Ferrari: [phone]  - Dr. Nicole Kindred: [phone]  In the event of inclement weather, please call our main line at [phone] for an update on the status of any delays or closures.  Dermatology Medication Tips: Please keep the boxes that topical medications come in in order to help keep track of the instructions about where and how to use these. Pharmacies typically print the medication instructions only on the boxes and not directly on  the medication tubes.   If your medication is too expensive, please contact our office at [phone] option 4 or send [place] a message through [place].   We are unable to tell what your co-pay for medications will be in advance as this is different depending on your insurance coverage. However, we may be able to find a substitute medication at lower cost or fill out paperwork to get insurance to cover a needed medication.   If a prior authorization is required to get your medication covered by your insurance company, please allow [place] 1-2 business days to complete this process.  Drug prices often vary depending on where the prescription is filled and some pharmacies may offer cheaper prices.  The website www.goodrx.com contains coupons for medications through different pharmacies. The prices here do not account for what the cost may be with help from insurance (it may be cheaper with your insurance), but the website can give you the price if you did not use any insurance.  - You can print the associated coupon and take it with your prescription to the pharmacy.  - You may also stop by our office during regular business hours and pick up a GoodRx coupon card.  - If you need your prescription sent electronically to a different pharmacy, notify our office through [facility] or by phone at [phone] option 4.     Si Usted Necesita Algo Despus de Su Visita  Tambin puede enviarnos un mensaje a travs de Pharmacist, community. Por lo general respondemos a los mensajes de MyChart en el transcurso de 1 a 2 das hbiles.  Para renovar recetas, por favor pida a su farmacia que se ponga en contacto con nuestra oficina. Harland Dingwall de fax es [place] [phone].  Si tiene un asunto urgente cuando la clnica est cerrada y que no puede esperar hasta el siguiente da hbil, puede llamar/localizar a su doctor(a) al nmero que aparece a continuacin.   Por favor, tenga en cuenta que aunque hacemos todo lo  posible para estar disponibles para asuntos urgentes fuera del horario de [place], no estamos disponibles las 24 horas del da, los 7 das de la [place].   Si tiene un problema urgente y no puede comunicarse con nosotros, puede optar por buscar atencin mdica  en el consultorio de su doctor(a), en una clnica privada, en un centro de atencin urgente o en una sala de emergencias.  Si tiene Engineering geologist, por favor llame inmediatamente al 911 o vaya a la sala de emergencias.  Nmeros de bper  - Dr. Nehemiah Massed: [phone]  - Dra. Moye: [phone]  - Dra. Nicole Kindred: [phone]  En caso de inclemencias del [place], por favor llame a Johnsie Kindred principal al [phone] para una actualizacin sobre el [place] de cualquier retraso o cierre.  Consejos para la medicacin en dermatologa: Por favor, guarde las cajas en las que vienen los medicamentos de uso tpico para ayudarle  a seguir las [employer] dnde y cmo usarlos. Las farmacias generalmente imprimen las instrucciones del medicamento slo en las cajas y no directamente en los tubos del [place].   Si su medicamento es muy caro, por favor, pngase en contacto con Zigmund Daniel llamando al [phone] y presione la opcin 4 o envenos un mensaje a travs de Pharmacist, community.   No podemos decirle cul ser su copago por los medicamentos por adelantado ya que esto es diferente dependiendo de la cobertura de su seguro. Sin embargo, es posible que podamos encontrar un medicamento sustituto a Electrical engineer un formulario para que el seguro cubra el medicamento que se considera necesario.   Si se requiere una autorizacin previa para que su compaa de seguros [place] su medicamento, por favor permtanos de 1 a 2 das hbiles para completar este proceso.  Los precios de los medicamentos varan con frecuencia dependiendo del Environmental consultant de dnde se surte la receta y alguna farmacias pueden ofrecer precios ms baratos.  El sitio web  www.goodrx.com tiene cupones para medicamentos de Airline pilot. Los precios aqu no tienen en cuenta lo que podra costar con la ayuda del seguro (puede ser ms barato con su seguro), pero el sitio web puede darle el precio si no utiliz Research scientist (physical sciences).  - Puede imprimir el cupn correspondiente y llevarlo con su receta a la farmacia.  - Tambin puede pasar por nuestra oficina durante el horario de atencin regular y Charity fundraiser una tarjeta de cupones de GoodRx.  - Si necesita que su receta se enve electrnicamente a una farmacia diferente, informe a nuestra oficina a travs de MyChart de [place] o por telfono llamando al [phone] y presione la opcin 4.

## 2022-05-26 NOTE — Progress Notes (Signed)
Follow-Up Visit   Subjective  Rachel Short is a 61 y.o. female who presents for the following: melasma (Patient was prescribed skin medicinals hydroquinone 12 % kojic acid vitamin c cream to use twice daily for 3 months. She reports improvement at left side of face but reports not much improvement at right side of face. ), Seborrheic Dermatitis (Patient prescribed flucinolone oil and using avlon keracare anti dandruff shampoo and conditioner. Patient reports has improve a little but still scabs at ears and scalp. ), and Other (Patient reports a few irritating spots at left chest and neck she would like checked. ).  The following portions of the chart were reviewed this encounter and updated as appropriate:  Tobacco  Allergies  Meds  Problems  Med Hx  Surg Hx  Fam Hx     Review of Systems: No other skin or systemic complaints except as noted in HPI or Assessment and Plan.  Objective  Well appearing patient in no apparent distress; mood and affect are within normal limits.  A focused examination was performed including face, neck, chest. Relevant physical exam findings are noted in the Assessment and Plan.  Scalp, eyebrows, ears Pink patches with greasy scale.   face Reticulated hyperpigmented patches.            left neck x 1  left chest x 1 (2) Erythematous stuck-on, waxy papule or plaque   Assessment & Plan  Seborrheic dermatitis Scalp, eyebrows, ears Seborrheic Dermatitis  -  is a chronic persistent rash characterized by pinkness and scaling most commonly of the mid face but also can occur on the scalp (dandruff), ears; mid chest, mid back and groin.  It tends to be exacerbated by stress and cooler weather.  People who have neurologic disease may experience new onset or exacerbation of existing seborrheic dermatitis.  The condition is not curable but treatable and can be controlled.   Chronic and persistent condition with duration or expected duration over one  year. Condition is symptomatic/ bothersome to patient. Not currently at goal.   Decrease Fluocinolone Acetonide Scalp oil 0.01 % to using MWF at bedtime.   Start kentoconazole shampoo 2 % can use daily to scalp, eyebrows ,and ears massage into areas, leave for several minutes and then rinse off.    ketoconazole (NIZORAL) 2 % shampoo - Scalp, eyebrows, ears Apply 1 application. topically daily. apply to scalp, eyebrows and ears, massage into areas and leave in for several minutes before rinsing out Related Medications Fluocinolone Acetonide Scalp 0.01 % OIL Apply QD to scalp and ears PRN only  Melasma Face See photos Melasma is a chronic; persistent condition of hyperpigmented patches generally on the face, worse in summer due to higher UV exposure.    Heredity; thyroid disease; sun exposure; pregnancy; birth control pills; epilepsy medication and darker skin may predispose to Melasma.   Recommendations include: - Sun avoidance and daily broad spectrum (UVA/UVB) sunscreen SPF 30+, preferably with Zinc or Titanium Dioxide. - Rx topical bleaching creams (i.e. hydroquinone) is a common treatment but should not be used long term.  Hydroquinones may be mixed with retinoids; steroids; Kojic Acid. - Rx Azelaic Acid is also a treatment option that is safe for pregnancy (Category B). - OTC Heliocare can be helpful in control and prevention. - Oral Rx with Tranexamic Acid 250 mg - 650 mg po daily can be used for moderate to severe cases especially during summer (contraindications include pregnancy; lactation; hx of PE; DVT; clotting disorder; heart  disease; anticoagulant use and upcoming long trips)   - Chemical peels (would need multiple for best result).  - Lasers and  Microdermabrasion may also be helpful adjunct  treatments. Chronic and persistent condition with duration or expected duration over one year. Condition is bothersome/symptomatic for patient. Currently flared.  Patient has been off  Skin Medicinal Hydroquinone 12 % / kojic acid/vitamin C cream for 1 month, Continue to hold.   Start Tranexamic acid 650 mg tab -  1  tab po qd for 3 months  Discussed with patient and patient denies contraindications including pregnancy; lactation; hx of PE; DVT; clotting disorder; heart disease; anticoagulant use and upcoming long trips)   Discussed if planning long trip will need to stop taking Tranexamic acid until return from trip. Patient verbalized understanding  Will recheck at follow up  tranexamic acid (LYSTEDA) 650 MG TABS tablet - face Take 1 (650 mg ) tablet by mouth daily  Inflamed seborrheic keratosis (2) Versus viral wart left neck x 1  left chest x 1 Symptomatic, irritating, patient would like treated.  Destruction of lesion - left neck x 1  left chest x 1 Complexity: simple   Destruction method: cryotherapy   Informed consent: discussed and consent obtained   Timeout:  patient name, date of birth, surgical site, and procedure verified Lesion destroyed using liquid nitrogen: Yes   Region frozen until ice ball extended beyond lesion: Yes   Outcome: patient tolerated procedure well with no complications   Post-procedure details: wound care instructions given   Additional details:  Prior to procedure, discussed risks of blister formation, small wound, skin dyspigmentation, or rare scar following cryotherapy. Recommend Vaseline ointment to treated areas while healing.  Return in about 3 months (around 08/26/2022) for melasma/seb derm/ iskvs wart f/u . IRuthell Rummage, CMA, am acting as scribe for Sarina Ser, MD. Documentation: I have reviewed the above documentation for accuracy and completeness, and I agree with the above.  Sarina Ser, MD

## 2022-05-30 ENCOUNTER — Encounter: Payer: Self-pay | Admitting: Dermatology

## 2022-08-26 ENCOUNTER — Other Ambulatory Visit: Payer: Self-pay | Admitting: Internal Medicine

## 2022-08-26 DIAGNOSIS — Z1231 Encounter for screening mammogram for malignant neoplasm of breast: Secondary | ICD-10-CM

## 2022-08-31 ENCOUNTER — Ambulatory Visit: Payer: Medicare Other | Admitting: Dermatology

## 2022-09-07 ENCOUNTER — Ambulatory Visit: Payer: Medicare Other | Admitting: Dermatology

## 2022-09-21 ENCOUNTER — Ambulatory Visit
Admission: RE | Admit: 2022-09-21 | Discharge: 2022-09-21 | Disposition: A | Discharge status: Home / Self Care | Payer: Medicare Other | Source: Ambulatory Visit | Attending: Internal Medicine | Admitting: Internal Medicine

## 2022-09-21 DIAGNOSIS — Z1231 Encounter for screening mammogram for malignant neoplasm of breast: Secondary | ICD-10-CM | POA: Insufficient documentation

## 2023-06-02 ENCOUNTER — Other Ambulatory Visit: Payer: Self-pay | Admitting: Dermatology

## 2023-06-02 DIAGNOSIS — L219 Seborrheic dermatitis, unspecified: Secondary | ICD-10-CM

## 2023-10-27 ENCOUNTER — Other Ambulatory Visit: Payer: Self-pay | Admitting: Primary Care

## 2023-10-27 DIAGNOSIS — Z1231 Encounter for screening mammogram for malignant neoplasm of breast: Secondary | ICD-10-CM

## 2023-11-10 ENCOUNTER — Ambulatory Visit
Admission: RE | Admit: 2023-11-10 | Discharge: 2023-11-10 | Disposition: A | Discharge status: Home / Self Care | Payer: Medicare Other | Source: Ambulatory Visit | Attending: Primary Care | Admitting: Primary Care

## 2023-11-10 DIAGNOSIS — Z1231 Encounter for screening mammogram for malignant neoplasm of breast: Secondary | ICD-10-CM | POA: Diagnosis present
